# Patient Record
Sex: Female | Born: 1950 | Race: White | Marital: Married | Smoking: Former smoker
Health system: Southern US, Community
[De-identification: ages and names within clinical notes are randomized; demographics above are authoritative.]

## PROBLEM LIST (undated history)

## (undated) DIAGNOSIS — J019 Acute sinusitis, unspecified: Secondary | ICD-10-CM

## (undated) DIAGNOSIS — R5383 Other fatigue: Secondary | ICD-10-CM

## (undated) DIAGNOSIS — M545 Low back pain, unspecified: Secondary | ICD-10-CM

## (undated) DIAGNOSIS — M2550 Pain in unspecified joint: Secondary | ICD-10-CM

## (undated) DIAGNOSIS — E785 Hyperlipidemia, unspecified: Secondary | ICD-10-CM

## (undated) DIAGNOSIS — D649 Anemia, unspecified: Secondary | ICD-10-CM

## (undated) DIAGNOSIS — R42 Dizziness and giddiness: Secondary | ICD-10-CM

## (undated) DIAGNOSIS — E0500 Thyrotoxicosis with diffuse goiter without thyrotoxic crisis or storm: Secondary | ICD-10-CM

## (undated) DIAGNOSIS — N813 Complete uterovaginal prolapse: Secondary | ICD-10-CM

## (undated) DIAGNOSIS — R5381 Other malaise: Secondary | ICD-10-CM

## (undated) HISTORY — DX: Low back pain, unspecified: M54.50

## (undated) HISTORY — DX: Pain in unspecified joint: M25.50

## (undated) HISTORY — DX: Thyrotoxicosis with diffuse goiter without thyrotoxic crisis or storm: E05.00

## (undated) HISTORY — PX: CATARACT EXTRACTION: SUR2

## (undated) HISTORY — DX: Other fatigue: R53.83

## (undated) HISTORY — DX: Other malaise: R53.81

## (undated) HISTORY — PX: TUBAL LIGATION: SHX77

## (undated) HISTORY — DX: Hyperlipidemia, unspecified: E78.5

## (undated) HISTORY — PX: OTHER SURGICAL HISTORY: SHX169

## (undated) HISTORY — DX: Low back pain: M54.5

## (undated) HISTORY — PX: ABDOMINAL HYSTERECTOMY: SHX81

## (undated) HISTORY — DX: Complete uterovaginal prolapse: N81.3

## (undated) HISTORY — DX: Dizziness and giddiness: R42

## (undated) HISTORY — PX: BREAST BIOPSY: SHX20

## (undated) HISTORY — DX: Acute sinusitis, unspecified: J01.90

## (undated) HISTORY — DX: Anemia, unspecified: D64.9

---

## 1999-11-28 ENCOUNTER — Encounter: Admission: RE | Admit: 1999-11-28 | Discharge: 1999-11-28 | Payer: Self-pay | Admitting: Family Medicine

## 1999-11-28 ENCOUNTER — Encounter: Payer: Self-pay | Admitting: Family Medicine

## 2000-11-30 ENCOUNTER — Encounter: Admission: RE | Admit: 2000-11-30 | Discharge: 2000-11-30 | Payer: Self-pay | Admitting: Family Medicine

## 2000-11-30 ENCOUNTER — Encounter: Payer: Self-pay | Admitting: Family Medicine

## 2001-08-24 ENCOUNTER — Encounter: Admission: RE | Admit: 2001-08-24 | Discharge: 2001-08-24 | Payer: Self-pay | Admitting: Family Medicine

## 2001-08-24 ENCOUNTER — Encounter: Payer: Self-pay | Admitting: Family Medicine

## 2002-01-19 ENCOUNTER — Encounter: Admission: RE | Admit: 2002-01-19 | Discharge: 2002-01-19 | Payer: Self-pay | Admitting: Family Medicine

## 2002-01-19 ENCOUNTER — Encounter: Payer: Self-pay | Admitting: Family Medicine

## 2002-01-31 ENCOUNTER — Encounter: Payer: Self-pay | Admitting: Family Medicine

## 2002-01-31 ENCOUNTER — Encounter: Admission: RE | Admit: 2002-01-31 | Discharge: 2002-01-31 | Payer: Self-pay | Admitting: Family Medicine

## 2003-01-23 ENCOUNTER — Encounter: Payer: Self-pay | Admitting: Family Medicine

## 2003-01-23 ENCOUNTER — Encounter: Admission: RE | Admit: 2003-01-23 | Discharge: 2003-01-23 | Payer: Self-pay | Admitting: Family Medicine

## 2004-01-24 ENCOUNTER — Encounter: Admission: RE | Admit: 2004-01-24 | Discharge: 2004-01-24 | Payer: Self-pay | Admitting: Family Medicine

## 2005-01-31 ENCOUNTER — Encounter: Admission: RE | Admit: 2005-01-31 | Discharge: 2005-01-31 | Payer: Self-pay | Admitting: Family Medicine

## 2005-02-14 ENCOUNTER — Encounter: Admission: RE | Admit: 2005-02-14 | Discharge: 2005-02-14 | Payer: Self-pay | Admitting: Family Medicine

## 2005-02-24 ENCOUNTER — Encounter: Admission: RE | Admit: 2005-02-24 | Discharge: 2005-02-24 | Payer: Self-pay | Admitting: Family Medicine

## 2005-03-21 ENCOUNTER — Ambulatory Visit: Payer: Self-pay | Admitting: Internal Medicine

## 2005-04-04 ENCOUNTER — Ambulatory Visit: Payer: Self-pay | Admitting: Internal Medicine

## 2006-01-19 ENCOUNTER — Encounter: Admission: RE | Admit: 2006-01-19 | Discharge: 2006-01-19 | Payer: Self-pay | Admitting: Family Medicine

## 2006-02-03 ENCOUNTER — Encounter: Admission: RE | Admit: 2006-02-03 | Discharge: 2006-02-03 | Payer: Self-pay | Admitting: Family Medicine

## 2007-02-08 ENCOUNTER — Encounter: Admission: RE | Admit: 2007-02-08 | Discharge: 2007-02-08 | Payer: Self-pay | Admitting: Family Medicine

## 2008-03-01 ENCOUNTER — Encounter: Admission: RE | Admit: 2008-03-01 | Discharge: 2008-03-01 | Payer: Self-pay | Admitting: Family Medicine

## 2008-04-26 ENCOUNTER — Encounter: Admission: RE | Admit: 2008-04-26 | Discharge: 2008-04-26 | Payer: Self-pay | Admitting: Family Medicine

## 2009-03-13 ENCOUNTER — Encounter: Admission: RE | Admit: 2009-03-13 | Discharge: 2009-03-13 | Payer: Self-pay | Admitting: Family Medicine

## 2010-03-19 ENCOUNTER — Encounter: Admission: RE | Admit: 2010-03-19 | Discharge: 2010-03-19 | Payer: Self-pay | Admitting: Family Medicine

## 2010-04-04 ENCOUNTER — Ambulatory Visit: Payer: Self-pay | Admitting: Cardiology

## 2010-04-25 ENCOUNTER — Ambulatory Visit: Payer: Self-pay

## 2010-04-25 ENCOUNTER — Encounter: Payer: Self-pay | Admitting: Cardiology

## 2010-04-25 ENCOUNTER — Ambulatory Visit (HOSPITAL_COMMUNITY)
Admission: RE | Admit: 2010-04-25 | Discharge: 2010-04-25 | Payer: Self-pay | Source: Home / Self Care | Attending: Cardiology | Admitting: Cardiology

## 2010-05-26 ENCOUNTER — Encounter: Payer: Self-pay | Admitting: Family Medicine

## 2010-07-16 ENCOUNTER — Ambulatory Visit (INDEPENDENT_AMBULATORY_CARE_PROVIDER_SITE_OTHER): Payer: BC Managed Care – PPO | Admitting: Cardiology

## 2010-07-16 DIAGNOSIS — R002 Palpitations: Secondary | ICD-10-CM

## 2010-07-16 DIAGNOSIS — I10 Essential (primary) hypertension: Secondary | ICD-10-CM

## 2010-07-31 ENCOUNTER — Other Ambulatory Visit: Payer: Self-pay | Admitting: Family Medicine

## 2010-07-31 DIAGNOSIS — M899 Disorder of bone, unspecified: Secondary | ICD-10-CM

## 2010-08-06 ENCOUNTER — Other Ambulatory Visit: Payer: Self-pay | Admitting: *Deleted

## 2010-08-06 DIAGNOSIS — I10 Essential (primary) hypertension: Secondary | ICD-10-CM

## 2010-08-06 MED ORDER — LISINOPRIL 5 MG PO TABS: 5.0000 mg | ORAL_TABLET | Freq: Every day | ORAL | Status: DC

## 2010-08-06 NOTE — Telephone Encounter (Signed)
escribe medication per fax request  

## 2010-08-26 ENCOUNTER — Ambulatory Visit
Admission: RE | Admit: 2010-08-26 | Discharge: 2010-08-26 | Disposition: A | Discharge status: Home / Self Care | Payer: BC Managed Care – PPO | Source: Ambulatory Visit | Attending: Family Medicine | Admitting: Family Medicine

## 2010-08-26 DIAGNOSIS — M899 Disorder of bone, unspecified: Secondary | ICD-10-CM

## 2011-02-12 ENCOUNTER — Other Ambulatory Visit: Payer: Self-pay | Admitting: Family Medicine

## 2011-02-12 DIAGNOSIS — Z1231 Encounter for screening mammogram for malignant neoplasm of breast: Secondary | ICD-10-CM

## 2011-03-25 ENCOUNTER — Ambulatory Visit: Payer: BC Managed Care – PPO

## 2011-04-01 ENCOUNTER — Ambulatory Visit
Admission: RE | Admit: 2011-04-01 | Discharge: 2011-04-01 | Disposition: A | Discharge status: Home / Self Care | Payer: BC Managed Care – PPO | Source: Ambulatory Visit | Attending: Family Medicine | Admitting: Family Medicine

## 2011-04-01 DIAGNOSIS — Z1231 Encounter for screening mammogram for malignant neoplasm of breast: Secondary | ICD-10-CM

## 2012-02-20 ENCOUNTER — Other Ambulatory Visit: Payer: Self-pay | Admitting: Family Medicine

## 2012-02-20 DIAGNOSIS — Z9289 Personal history of other medical treatment: Secondary | ICD-10-CM

## 2012-04-06 ENCOUNTER — Ambulatory Visit (INDEPENDENT_AMBULATORY_CARE_PROVIDER_SITE_OTHER): Payer: BC Managed Care – PPO

## 2012-04-06 DIAGNOSIS — Z9289 Personal history of other medical treatment: Secondary | ICD-10-CM

## 2012-04-06 DIAGNOSIS — R928 Other abnormal and inconclusive findings on diagnostic imaging of breast: Secondary | ICD-10-CM

## 2012-04-06 DIAGNOSIS — Z1231 Encounter for screening mammogram for malignant neoplasm of breast: Secondary | ICD-10-CM

## 2012-04-09 ENCOUNTER — Other Ambulatory Visit: Payer: Self-pay | Admitting: Family Medicine

## 2012-04-09 DIAGNOSIS — R928 Other abnormal and inconclusive findings on diagnostic imaging of breast: Secondary | ICD-10-CM

## 2012-04-21 ENCOUNTER — Ambulatory Visit
Admission: RE | Admit: 2012-04-21 | Discharge: 2012-04-21 | Disposition: A | Discharge status: Home / Self Care | Payer: BC Managed Care – PPO | Source: Ambulatory Visit | Attending: Family Medicine | Admitting: Family Medicine

## 2012-04-21 DIAGNOSIS — R928 Other abnormal and inconclusive findings on diagnostic imaging of breast: Secondary | ICD-10-CM

## 2012-04-22 ENCOUNTER — Other Ambulatory Visit: Payer: Self-pay | Admitting: Family Medicine

## 2012-04-22 DIAGNOSIS — R921 Mammographic calcification found on diagnostic imaging of breast: Secondary | ICD-10-CM

## 2012-05-03 ENCOUNTER — Other Ambulatory Visit: Payer: Self-pay | Admitting: Family Medicine

## 2012-05-03 DIAGNOSIS — R921 Mammographic calcification found on diagnostic imaging of breast: Secondary | ICD-10-CM

## 2012-05-04 ENCOUNTER — Ambulatory Visit
Admission: RE | Admit: 2012-05-04 | Discharge: 2012-05-04 | Disposition: A | Discharge status: Home / Self Care | Payer: BC Managed Care – PPO | Source: Ambulatory Visit | Attending: Family Medicine | Admitting: Family Medicine

## 2012-05-04 ENCOUNTER — Other Ambulatory Visit: Payer: BC Managed Care – PPO

## 2012-05-04 DIAGNOSIS — R921 Mammographic calcification found on diagnostic imaging of breast: Secondary | ICD-10-CM

## 2012-09-24 ENCOUNTER — Emergency Department (HOSPITAL_BASED_OUTPATIENT_CLINIC_OR_DEPARTMENT_OTHER)
Admission: EM | Admit: 2012-09-24 | Discharge: 2012-09-24 | Disposition: A | Discharge status: Home / Self Care | Payer: BC Managed Care – PPO | Attending: Emergency Medicine | Admitting: Emergency Medicine

## 2012-09-24 ENCOUNTER — Emergency Department (HOSPITAL_BASED_OUTPATIENT_CLINIC_OR_DEPARTMENT_OTHER): Payer: BC Managed Care – PPO

## 2012-09-24 ENCOUNTER — Encounter (HOSPITAL_BASED_OUTPATIENT_CLINIC_OR_DEPARTMENT_OTHER): Payer: Self-pay | Admitting: Emergency Medicine

## 2012-09-24 DIAGNOSIS — Z7982 Long term (current) use of aspirin: Secondary | ICD-10-CM | POA: Insufficient documentation

## 2012-09-24 DIAGNOSIS — M19049 Primary osteoarthritis, unspecified hand: Secondary | ICD-10-CM | POA: Insufficient documentation

## 2012-09-24 DIAGNOSIS — Y939 Activity, unspecified: Secondary | ICD-10-CM | POA: Insufficient documentation

## 2012-09-24 DIAGNOSIS — S8990XA Unspecified injury of unspecified lower leg, initial encounter: Secondary | ICD-10-CM | POA: Insufficient documentation

## 2012-09-24 DIAGNOSIS — Y929 Unspecified place or not applicable: Secondary | ICD-10-CM | POA: Insufficient documentation

## 2012-09-24 DIAGNOSIS — Z8709 Personal history of other diseases of the respiratory system: Secondary | ICD-10-CM | POA: Insufficient documentation

## 2012-09-24 DIAGNOSIS — Z862 Personal history of diseases of the blood and blood-forming organs and certain disorders involving the immune mechanism: Secondary | ICD-10-CM | POA: Insufficient documentation

## 2012-09-24 DIAGNOSIS — M19042 Primary osteoarthritis, left hand: Secondary | ICD-10-CM

## 2012-09-24 DIAGNOSIS — Z8639 Personal history of other endocrine, nutritional and metabolic disease: Secondary | ICD-10-CM | POA: Insufficient documentation

## 2012-09-24 DIAGNOSIS — S99919A Unspecified injury of unspecified ankle, initial encounter: Secondary | ICD-10-CM | POA: Insufficient documentation

## 2012-09-24 DIAGNOSIS — W2209XA Striking against other stationary object, initial encounter: Secondary | ICD-10-CM | POA: Insufficient documentation

## 2012-09-24 DIAGNOSIS — Z79899 Other long term (current) drug therapy: Secondary | ICD-10-CM | POA: Insufficient documentation

## 2012-09-24 DIAGNOSIS — E785 Hyperlipidemia, unspecified: Secondary | ICD-10-CM | POA: Insufficient documentation

## 2012-09-24 DIAGNOSIS — Z8659 Personal history of other mental and behavioral disorders: Secondary | ICD-10-CM | POA: Insufficient documentation

## 2012-09-24 MED ORDER — NAPROXEN 500 MG PO TABS
500.0000 mg | ORAL_TABLET | Freq: Two times a day (BID) | ORAL | Status: DC
Start: 2012-09-24 — End: 2013-04-12

## 2012-09-24 NOTE — ED Provider Notes (Signed)
History     CSN: [id number]  Arrival date & time 09/24/12  1524   First MD Initiated Contact with Patient 09/24/12 1543      Chief Complaint  Patient presents with  . Hand Injury    (Consider location/radiation/quality/duration/timing/severity/associated sxs/prior treatment) Patient is a 62 y.o. female presenting with hand injury. The history is provided by the patient. No language interpreter was used.  Hand Injury Location:  Hand Hand location:  R hand Pain details:    Radiates to:  Does not radiate   Severity:  No pain   Onset quality:  Sudden   Duration:  1 day   Timing:  Intermittent   Progression:  Waxing and waning Chronicity:  New Dislocation: no   Foreign body present:  No foreign bodies Prior injury to area:  No Ineffective treatments:  None tried   Past Medical History  Diagnosis Date  . Acute sinusitis, unspecified   . Other malaise and fatigue   . Lower back pain   . Light headedness   . Joint pain   . Hyperlipidemia   . Graves' disease   . "Walking corpse" syndrome     Past Surgical History  Procedure Laterality Date  . Cataract extraction    . Tubal ligation      No family history on file.  History  Substance Use Topics  . Smoking status: Never Smoker   . Smokeless tobacco: Not on file  . Alcohol Use: Yes     Comment: socially on weekends    OB History   Grav Para Term Preterm Abortions TAB SAB Ect Mult Living                  Review of Systems  Musculoskeletal: Positive for arthralgias.  All other systems reviewed and are negative.    Allergies  Erythromycin  Home Medications   Current Outpatient Rx  Name  Route  Sig  Dispense  Refill  . aspirin 81 MG tablet   Oral   Take 81 mg by mouth daily.         . calcium carbonate 1250 MG capsule   Oral   Take 1,250 mg by mouth 2 (two) times daily with a meal.         . fish oil-omega-3 fatty acids 1000 MG capsule   Oral   Take 1 g by mouth 2 (two) times daily.        Marland Kitchen glucosamine-chondroitin 500-400 MG tablet   Oral   Take 1 tablet by mouth 3 (three) times daily.         . Multiple Vitamin (MULTIVITAMIN WITH MINERALS) TABS   Oral   Take 1 tablet by mouth daily.         . potassium chloride SA (K-DUR,KLOR-CON) 20 MEQ tablet   Oral   Take 20 mEq by mouth 2 (two) times daily.         . simvastatin (ZOCOR) 10 MG tablet   Oral   Take 10 mg by mouth at bedtime.         Marland Kitchen EXPIRED: lisinopril (PRINIVIL,ZESTRIL) 5 MG tablet   Oral   Take 1 tablet (5 mg total) by mouth daily.   30 tablet   5   . naproxen (NAPROSYN) 500 MG tablet   Oral   Take 1 tablet (500 mg total) by mouth 2 (two) times daily.   14 tablet   0     BP 133/89  Pulse 75  Temp(Src)  98 F (36.7 C) (Oral)  Resp 18  Ht 5\' 7"  (1.702 m)  Wt 163 lb (73.936 kg)  BMI 25.52 kg/m2  SpO2 98%  Physical Exam  Nursing note and vitals reviewed. Constitutional: She is oriented to person, place, and time. She appears well-developed and well-nourished.  HENT:  Head: Normocephalic.  Eyes: Pupils are equal, round, and reactive to light.  Neck: Normal range of motion.  Cardiovascular: Normal rate and regular rhythm.   Pulmonary/Chest: Effort normal and breath sounds normal.  Abdominal: Soft. Bowel sounds are normal.  Musculoskeletal: Normal range of motion. She exhibits edema.       Hands: Lymphadenopathy:    She has no cervical adenopathy.  Neurological: She is alert and oriented to person, place, and time.  Skin: Skin is warm and dry. There is erythema.  Psychiatric: She has a normal mood and affect. Her behavior is normal. Judgment and thought content normal.    ED Course  Procedures (including critical care time)  Labs Reviewed - No data to display Dg Hand Complete Right  09/24/2012   *RADIOLOGY REPORT*  Clinical Data: Right hand swelling and soreness for several days, pain, redness at base of ring finger, struck hand against cabinet 1 week ago  RIGHT HAND -  COMPLETE 3+ VIEW  Comparison: None  Findings: Diffuse osseous demineralization. Minimal scattered degenerative changes of interphalangeal joints. No acute fracture, dislocation or bone destruction. Soft tissue swelling at dorsum of hand overlying the distal metacarpals and MCP joints.  IMPRESSION: Scattered degenerative changes as above. No definite acute osseous abnormalities.   Original Report Authenticated By: Ulyses Southward, M.D.     1. Osteoarthritis of hand, left       MDM          Jimmye Norman, NP 09/24/12 2244

## 2012-09-24 NOTE — ED Notes (Signed)
Accidentally struck right hand against a wall or door last week.  Yesterday while she was exercising on her elliptical, she noticed that her right hand appeared swollen and she had difficulty opening and closing her hand.

## 2012-09-24 NOTE — ED Provider Notes (Signed)
Medical screening examination/treatment/procedure(s) were performed by non-physician practitioner and as supervising physician I was immediately available for consultation/collaboration.  Burkley Dech K Linker, MD 09/24/12 2338 

## 2012-12-31 ENCOUNTER — Encounter: Payer: Self-pay | Admitting: Internal Medicine

## 2013-01-04 ENCOUNTER — Encounter: Payer: Self-pay | Admitting: Internal Medicine

## 2013-03-04 ENCOUNTER — Encounter: Payer: Self-pay | Admitting: Internal Medicine

## 2013-03-18 ENCOUNTER — Encounter: Payer: BC Managed Care – PPO | Admitting: Internal Medicine

## 2013-04-11 ENCOUNTER — Other Ambulatory Visit: Payer: Self-pay | Admitting: Family Medicine

## 2013-04-11 DIAGNOSIS — Z1231 Encounter for screening mammogram for malignant neoplasm of breast: Secondary | ICD-10-CM

## 2013-04-12 ENCOUNTER — Ambulatory Visit (AMBULATORY_SURGERY_CENTER): Payer: Self-pay

## 2013-04-12 VITALS — Ht 67.0 in | Wt 167.2 lb

## 2013-04-12 DIAGNOSIS — Z8371 Family history of colonic polyps: Secondary | ICD-10-CM

## 2013-04-12 MED ORDER — MOVIPREP 100 G PO SOLR: ORAL | Status: DC

## 2013-04-19 ENCOUNTER — Encounter: Payer: Self-pay | Admitting: Internal Medicine

## 2013-04-25 ENCOUNTER — Ambulatory Visit (AMBULATORY_SURGERY_CENTER): Payer: BC Managed Care – PPO | Admitting: Internal Medicine

## 2013-04-25 ENCOUNTER — Encounter: Payer: Self-pay | Admitting: Internal Medicine

## 2013-04-25 VITALS — BP 132/84 | HR 57 | Temp 97.1°F | Resp 17 | Ht 67.0 in | Wt 167.0 lb

## 2013-04-25 DIAGNOSIS — Z8371 Family history of colonic polyps: Secondary | ICD-10-CM

## 2013-04-25 DIAGNOSIS — Z1211 Encounter for screening for malignant neoplasm of colon: Secondary | ICD-10-CM

## 2013-04-25 MED ORDER — SODIUM CHLORIDE 0.9 % IV SOLN: 500.0000 mL | INTRAVENOUS | Status: DC

## 2013-04-25 NOTE — Patient Instructions (Signed)
YOU HAD AN ENDOSCOPIC PROCEDURE TODAY AT THE [facility]: Refer to the procedure report that was given to you for any specific questions about what was found during the examination.  If the procedure report does not answer your questions, please call your gastroenterologist to clarify.  If you requested that your care partner not be given the details of your procedure findings, then the procedure report has been included in a sealed envelope for you to review at your convenience later.  YOU SHOULD EXPECT: Some feelings of bloating in the abdomen. Passage of more gas than usual.  Walking can help get rid of the air that was put into your GI tract during the procedure and reduce the bloating. If you had a lower endoscopy (such as a colonoscopy or flexible sigmoidoscopy) you may notice spotting of blood in your stool or on the toilet paper. If you underwent a bowel prep for your procedure, then you may not have a normal bowel movement for a few days.  DIET: Your first meal following the procedure should be a light meal and then it is ok to progress to your normal diet.  A half-sandwich or bowl of soup is an example of a good first meal.  Heavy or fried foods are harder to digest and may make you feel nauseous or bloated.  Likewise meals heavy in dairy and vegetables can cause extra gas to form and this can also increase the bloating.  Drink plenty of fluids but you should avoid alcoholic beverages for 24 hours.  ACTIVITY: Your care partner should take you home directly after the procedure.  You should plan to take it easy, moving slowly for the rest of the day.  You can resume normal activity the day after the procedure however you should NOT DRIVE or use heavy machinery for 24 hours (because of the sedation medicines used during the test).    SYMPTOMS TO REPORT IMMEDIATELY: A gastroenterologist can be reached at any hour.  During normal business hours, 8:30 AM to 5:00 PM Monday through Friday,  call [phone].  After hours and on weekends, please call the GI answering service at [phone] who will take a message and have the physician on call contact you.   Following lower endoscopy (colonoscopy or flexible sigmoidoscopy):  Excessive amounts of blood in the stool  Significant tenderness or worsening of abdominal pains  Swelling of the abdomen that is new, acute  Fever of 100F or higher   FOLLOW UP: If any biopsies were taken you will be contacted by phone or by letter within the next 1-3 weeks.  Call your gastroenterologist if you have not heard about the biopsies in 3 weeks.  Our staff will call the home number listed on your records the next business day following your procedure to check on you and address any questions or concerns that you may have at that time regarding the information given to you following your procedure. This is a courtesy call and so if there is no answer at the home number and we have not heard from you through the emergency physician on call, we will assume that you have returned to your regular daily activities without incident.  SIGNATURES/CONFIDENTIALITY: You and/or your care partner have signed paperwork which will be entered into your electronic medical record.  These signatures attest to the fact that that the information above on your After Visit Summary has been reviewed and is understood.  Full responsibility of the confidentiality of   this discharge information lies with you and/or your care-partner.    A handout was given to your care partner on a high fiber diet with liberal fluid intake. You may resume your current medications today. Please call if any questions or concerns.    

## 2013-04-25 NOTE — Op Note (Signed)
[facility] [address] [place], [address]   COLONOSCOPY PROCEDURE REPORT  PATIENT: Dawn Underwood, Dawn Underwood  MR#: [id number] BIRTHDATE: Dec 03, 1950 , 62  yrs. old GENDER: Female ENDOSCOPIST: Hart Carwin, MD REFERRED AO:ZHYQ Arlyce Dice, [place] PROCEDURE DATE:  04/25/2013 PROCEDURE:   Colonoscopy, screening First Screening Colonoscopy - Avg.  risk and is 50 yrs.  old or older - No.  Prior Negative Screening - Now for repeat screening. Above average risk  History of Adenoma - Now for follow-up colonoscopy & has been > or = to 3 yrs.  N/A  Polyps Removed Today? No.  Recommend repeat exam, <10 yrs? No. ASA CLASS:   Class II INDICATIONS:family history of colon polyps in patient's mother. Last colonoscopy was in 2006 and again in 2007 elsewhere,no records. MEDICATIONS: MAC sedation, administered by CRNA and propofol (Diprivan) 200mg  IV  DESCRIPTION OF PROCEDURE:   After the risks benefits and alternatives of the procedure were thoroughly explained, informed consent was obtained.  A digital rectal exam revealed no abnormalities of the rectum.   The LB [id number]-[id number]  endoscope was introduced through the anus and advanced to the cecum, which was identified by both the appendix and ileocecal valve. No adverse events experienced.   The quality of the prep was good, using MoviPrep  The instrument was then slowly withdrawn as the colon was fully examined.      COLON FINDINGS: A normal appearing cecum, ileocecal valve, and appendiceal orifice were identified.  The ascending, hepatic flexure, transverse, splenic flexure, descending, sigmoid colon and rectum appeared unremarkable.  No polyps or cancers were seen. Retroflexed views revealed no abnormalities. The time to cecum=6 minutes 17 seconds.  Withdrawal time=6 minutes 10 seconds.  The scope was withdrawn and the procedure completed. COMPLICATIONS: There were no complications.  ENDOSCOPIC IMPRESSION: Normal  colon  RECOMMENDATIONS: high fiber diet Recall colonoscopy in 10 years   eSigned:  Hart Carwin, MD 04/25/2013 3:07 PM   cc:

## 2013-04-25 NOTE — Progress Notes (Signed)
No complaints noted in the recovery room. Maw   

## 2013-04-25 NOTE — Progress Notes (Signed)
Procedure ends, to recovery, report given and VSS. 

## 2013-04-26 ENCOUNTER — Telehealth: Payer: Self-pay | Admitting: *Deleted

## 2013-04-26 NOTE — Telephone Encounter (Signed)
No answer, left message to call if questions or concerns. 

## 2013-05-10 ENCOUNTER — Ambulatory Visit (INDEPENDENT_AMBULATORY_CARE_PROVIDER_SITE_OTHER): Payer: BC Managed Care – PPO

## 2013-05-10 DIAGNOSIS — Z1231 Encounter for screening mammogram for malignant neoplasm of breast: Secondary | ICD-10-CM

## 2013-08-22 ENCOUNTER — Other Ambulatory Visit: Payer: Self-pay | Admitting: Family Medicine

## 2013-08-22 DIAGNOSIS — M8580 Other specified disorders of bone density and structure, unspecified site: Secondary | ICD-10-CM

## 2013-08-23 ENCOUNTER — Ambulatory Visit (INDEPENDENT_AMBULATORY_CARE_PROVIDER_SITE_OTHER): Payer: BC Managed Care – PPO

## 2013-08-23 DIAGNOSIS — M949 Disorder of cartilage, unspecified: Secondary | ICD-10-CM

## 2013-08-23 DIAGNOSIS — M8580 Other specified disorders of bone density and structure, unspecified site: Secondary | ICD-10-CM

## 2013-08-23 DIAGNOSIS — M899 Disorder of bone, unspecified: Secondary | ICD-10-CM

## 2013-09-05 ENCOUNTER — Other Ambulatory Visit: Payer: BC Managed Care – PPO

## 2014-04-11 ENCOUNTER — Other Ambulatory Visit: Payer: Self-pay | Admitting: Family Medicine

## 2014-04-11 DIAGNOSIS — Z1231 Encounter for screening mammogram for malignant neoplasm of breast: Secondary | ICD-10-CM

## 2014-05-11 ENCOUNTER — Ambulatory Visit (INDEPENDENT_AMBULATORY_CARE_PROVIDER_SITE_OTHER): Payer: BLUE CROSS/BLUE SHIELD

## 2014-05-11 DIAGNOSIS — Z1231 Encounter for screening mammogram for malignant neoplasm of breast: Secondary | ICD-10-CM

## 2015-04-11 ENCOUNTER — Other Ambulatory Visit: Payer: Self-pay | Admitting: Family Medicine

## 2015-04-11 DIAGNOSIS — Z1231 Encounter for screening mammogram for malignant neoplasm of breast: Secondary | ICD-10-CM

## 2015-04-16 ENCOUNTER — Encounter (HOSPITAL_COMMUNITY): Payer: Self-pay

## 2015-04-16 ENCOUNTER — Inpatient Hospital Stay (HOSPITAL_COMMUNITY)
Admission: EM | Admit: 2015-04-16 | Discharge: 2015-04-18 | DRG: 392 | Disposition: A | Discharge status: Home / Self Care | Payer: Medicare HMO | Attending: Internal Medicine | Admitting: Internal Medicine

## 2015-04-16 ENCOUNTER — Emergency Department (HOSPITAL_COMMUNITY): Payer: Medicare HMO

## 2015-04-16 DIAGNOSIS — K625 Hemorrhage of anus and rectum: Secondary | ICD-10-CM | POA: Diagnosis present

## 2015-04-16 DIAGNOSIS — E0500 Thyrotoxicosis with diffuse goiter without thyrotoxic crisis or storm: Secondary | ICD-10-CM | POA: Diagnosis present

## 2015-04-16 DIAGNOSIS — Z833 Family history of diabetes mellitus: Secondary | ICD-10-CM

## 2015-04-16 DIAGNOSIS — R55 Syncope and collapse: Secondary | ICD-10-CM | POA: Diagnosis present

## 2015-04-16 DIAGNOSIS — R112 Nausea with vomiting, unspecified: Secondary | ICD-10-CM | POA: Diagnosis not present

## 2015-04-16 DIAGNOSIS — Z79899 Other long term (current) drug therapy: Secondary | ICD-10-CM

## 2015-04-16 DIAGNOSIS — K529 Noninfective gastroenteritis and colitis, unspecified: Principal | ICD-10-CM | POA: Diagnosis present

## 2015-04-16 DIAGNOSIS — Z881 Allergy status to other antibiotic agents status: Secondary | ICD-10-CM

## 2015-04-16 DIAGNOSIS — I10 Essential (primary) hypertension: Secondary | ICD-10-CM | POA: Diagnosis present

## 2015-04-16 DIAGNOSIS — Z7982 Long term (current) use of aspirin: Secondary | ICD-10-CM

## 2015-04-16 DIAGNOSIS — E876 Hypokalemia: Secondary | ICD-10-CM | POA: Diagnosis present

## 2015-04-16 DIAGNOSIS — E785 Hyperlipidemia, unspecified: Secondary | ICD-10-CM | POA: Diagnosis present

## 2015-04-16 DIAGNOSIS — D696 Thrombocytopenia, unspecified: Secondary | ICD-10-CM | POA: Diagnosis present

## 2015-04-16 LAB — CBC
HCT: 37.5 % (ref 36.0–46.0)
Hemoglobin: 12.7 g/dL (ref 12.0–15.0)
MCH: 29.6 pg (ref 26.0–34.0)
MCHC: 33.9 g/dL (ref 30.0–36.0)
MCV: 87.4 fL (ref 78.0–100.0)
PLATELETS: 123 10*3/uL — AB (ref 150–400)
RBC: 4.29 MIL/uL (ref 3.87–5.11)
RDW: 13.2 % (ref 11.5–15.5)
WBC: 9.9 10*3/uL (ref 4.0–10.5)

## 2015-04-16 LAB — URINALYSIS, ROUTINE W REFLEX MICROSCOPIC
BILIRUBIN URINE: NEGATIVE
GLUCOSE, UA: NEGATIVE mg/dL
KETONES UR: NEGATIVE mg/dL
Nitrite: NEGATIVE
PROTEIN: NEGATIVE mg/dL
Specific Gravity, Urine: 1.005 (ref 1.005–1.030)
pH: 6 (ref 5.0–8.0)

## 2015-04-16 LAB — COMPREHENSIVE METABOLIC PANEL
ALT: 16 U/L (ref 14–54)
AST: 25 U/L (ref 15–41)
Albumin: 4.3 g/dL (ref 3.5–5.0)
Alkaline Phosphatase: 60 U/L (ref 38–126)
Anion gap: 10 (ref 5–15)
BUN: 12 mg/dL (ref 6–20)
CHLORIDE: 104 mmol/L (ref 101–111)
CO2: 26 mmol/L (ref 22–32)
CREATININE: 0.75 mg/dL (ref 0.44–1.00)
Calcium: 9.9 mg/dL (ref 8.9–10.3)
GFR calc Af Amer: >60 mL/min (ref >60)
Glucose, Bld: 101 mg/dL — ABNORMAL HIGH (ref 65–99)
Potassium: 3.6 mmol/L (ref 3.5–5.1)
Sodium: 140 mmol/L (ref 135–145)
Total Bilirubin: 1.3 mg/dL — ABNORMAL HIGH (ref 0.3–1.2)
Total Protein: 8 g/dL (ref 6.5–8.1)

## 2015-04-16 LAB — HEMOGLOBIN AND HEMATOCRIT, BLOOD
HEMATOCRIT: 30.5 % — AB (ref 36.0–46.0)
Hemoglobin: 10.2 g/dL — ABNORMAL LOW (ref 12.0–15.0)

## 2015-04-16 LAB — ABO/RH: ABO/RH(D): O POS

## 2015-04-16 LAB — URINE MICROSCOPIC-ADD ON

## 2015-04-16 LAB — LIPASE, BLOOD: LIPASE: 40 U/L (ref 11–51)

## 2015-04-16 LAB — TYPE AND SCREEN
ABO/RH(D): O POS
Antibody Screen: NEGATIVE

## 2015-04-16 LAB — POC OCCULT BLOOD, ED: Fecal Occult Bld: POSITIVE — AB

## 2015-04-16 MED ORDER — DEXTROSE-NACL 5-0.45 % IV SOLN
INTRAVENOUS | Status: DC
  Administered 2015-04-16 – 2015-04-18 (×4): via INTRAVENOUS

## 2015-04-16 MED ORDER — SODIUM CHLORIDE 0.9 % IV BOLUS (SEPSIS)
1000.0000 mL | Freq: Once | INTRAVENOUS | Status: AC
Start: 2015-04-16 — End: 2015-04-16
  Administered 2015-04-16: 1000 mL via INTRAVENOUS

## 2015-04-16 MED ORDER — OXYCODONE HCL 5 MG PO TABS: 5.0000 mg | ORAL_TABLET | ORAL | Status: DC | PRN

## 2015-04-16 MED ORDER — METRONIDAZOLE IN NACL 5-0.79 MG/ML-% IV SOLN
500.0000 mg | Freq: Once | INTRAVENOUS | Status: AC
  Administered 2015-04-16: 500 mg via INTRAVENOUS
  Filled 2015-04-16: qty 100

## 2015-04-16 MED ORDER — METRONIDAZOLE IN NACL 5-0.79 MG/ML-% IV SOLN
500.0000 mg | Freq: Three times a day (TID) | INTRAVENOUS | Status: DC
  Administered 2015-04-16 – 2015-04-18 (×5): 500 mg via INTRAVENOUS
  Filled 2015-04-16 (×5): qty 100

## 2015-04-16 MED ORDER — PANTOPRAZOLE SODIUM 40 MG IV SOLR
40.0000 mg | Freq: Once | INTRAVENOUS | Status: AC
  Administered 2015-04-16: 40 mg via INTRAVENOUS
  Filled 2015-04-16: qty 40

## 2015-04-16 MED ORDER — MORPHINE SULFATE (PF) 2 MG/ML IV SOLN
2.0000 mg | Freq: Once | INTRAVENOUS | Status: AC
  Administered 2015-04-16: 2 mg via INTRAVENOUS
  Filled 2015-04-16: qty 1

## 2015-04-16 MED ORDER — METHOCARBAMOL 500 MG PO TABS: 500.0000 mg | ORAL_TABLET | Freq: Two times a day (BID) | ORAL | Status: DC | PRN

## 2015-04-16 MED ORDER — CETYLPYRIDINIUM CHLORIDE 0.05 % MT LIQD
7.0000 mL | Freq: Two times a day (BID) | OROMUCOSAL | Status: DC
  Administered 2015-04-16 – 2015-04-18 (×4): 7 mL via OROMUCOSAL

## 2015-04-16 MED ORDER — ACETAMINOPHEN 325 MG PO TABS: 650.0000 mg | ORAL_TABLET | Freq: Four times a day (QID) | ORAL | Status: DC | PRN

## 2015-04-16 MED ORDER — ONDANSETRON HCL 4 MG PO TABS: 4.0000 mg | ORAL_TABLET | Freq: Four times a day (QID) | ORAL | Status: DC | PRN

## 2015-04-16 MED ORDER — CIPROFLOXACIN IN D5W 400 MG/200ML IV SOLN
400.0000 mg | Freq: Two times a day (BID) | INTRAVENOUS | Status: DC
  Administered 2015-04-16 – 2015-04-17 (×3): 400 mg via INTRAVENOUS
  Filled 2015-04-16 (×3): qty 200

## 2015-04-16 MED ORDER — MORPHINE SULFATE (PF) 2 MG/ML IV SOLN: 1.0000 mg | INTRAVENOUS | Status: DC | PRN

## 2015-04-16 MED ORDER — IOHEXOL 300 MG/ML  SOLN
50.0000 mL | Freq: Once | INTRAMUSCULAR | Status: AC | PRN
  Administered 2015-04-16: 50 mL via ORAL

## 2015-04-16 MED ORDER — IOHEXOL 300 MG/ML  SOLN
100.0000 mL | Freq: Once | INTRAMUSCULAR | Status: AC | PRN
  Administered 2015-04-16: 100 mL via INTRAVENOUS

## 2015-04-16 MED ORDER — CHLORHEXIDINE GLUCONATE 0.12 % MT SOLN
15.0000 mL | Freq: Two times a day (BID) | OROMUCOSAL | Status: DC
  Administered 2015-04-16 – 2015-04-18 (×4): 15 mL via OROMUCOSAL
  Filled 2015-04-16 (×5): qty 15

## 2015-04-16 MED ORDER — ONDANSETRON HCL 4 MG/2ML IJ SOLN: 4.0000 mg | Freq: Four times a day (QID) | INTRAMUSCULAR | Status: DC | PRN

## 2015-04-16 MED ORDER — ONDANSETRON HCL 4 MG/2ML IJ SOLN
4.0000 mg | Freq: Once | INTRAMUSCULAR | Status: AC
  Administered 2015-04-16: 4 mg via INTRAVENOUS
  Filled 2015-04-16: qty 2

## 2015-04-16 MED ORDER — CIPROFLOXACIN IN D5W 400 MG/200ML IV SOLN
400.0000 mg | Freq: Once | INTRAVENOUS | Status: AC
  Administered 2015-04-16: 400 mg via INTRAVENOUS
  Filled 2015-04-16: qty 200

## 2015-04-16 MED ORDER — PANTOPRAZOLE SODIUM 40 MG IV SOLR
40.0000 mg | Freq: Two times a day (BID) | INTRAVENOUS | Status: DC
  Administered 2015-04-16 – 2015-04-17 (×4): 40 mg via INTRAVENOUS
  Filled 2015-04-16 (×3): qty 40

## 2015-04-16 MED ORDER — ACETAMINOPHEN 650 MG RE SUPP: 650.0000 mg | Freq: Four times a day (QID) | RECTAL | Status: DC | PRN

## 2015-04-16 NOTE — ED Notes (Addendum)
Pt went to [employer] yesterday.  Stomach was cramping.  Aprrox at 1pm ish patient stated she started sweating.  Pt began vomiting at restaurant.  Diarrhea started soon after.  Pt also states rectal bleeding.  Pt believes it is more blood than what she would have with her normal hemorrhoids.

## 2015-04-16 NOTE — ED Notes (Signed)
Patient transported to CT 

## 2015-04-16 NOTE — ED Provider Notes (Signed)
CSN: [id number]     Arrival date & time 04/16/15  [id number] History   First MD Initiated Contact with Patient 04/16/15 [phone]     Chief Complaint  Patient presents with  . Emesis  . Diarrhea     (Consider location/radiation/quality/duration/timing/severity/associated sxs/prior Treatment) Patient is a 64 y.o. female presenting with hematochezia. The history is provided by the patient.  Rectal Bleeding Quality:  Bright red Amount:  Moderate Duration:  2 days Timing:  Constant Progression:  Worsening Chronicity:  New Similar prior episodes: no   Relieved by:  Nothing Worsened by:  Nothing tried Ineffective treatments:  None tried Associated symptoms: abdominal pain (LLQ)   Associated symptoms: no dizziness, no fever and no vomiting   Risk factors: no anticoagulant use and no hx of IBD    64 yo F with a BRBPR.  Multiple episodes since last night.  Noticed blood mixed with stool, then started having just bright red blood.  Some mild LLQ pain.  Denies fevers, chills.  Vomiting last night improved this morning.  Past Medical History  Diagnosis Date  . Acute sinusitis, unspecified   . Other malaise and fatigue   . Lower back pain   . Light headedness   . Joint pain   . Hyperlipidemia   . Graves' disease   . Cystocele or rectocele with complete uterine prolapse   . Anemia    Past Surgical History  Procedure Laterality Date  . Cataract extraction      Bil  . Tubal ligation    . Abdominal hysterectomy    . Bladder mesh     Family History  Problem Relation Age of Onset  . Diabetes Father    Social History  Substance Use Topics  . Smoking status: Never Smoker   . Smokeless tobacco: Never Used  . Alcohol Use: 2.4 oz/week    4 Glasses of wine per week     Comment: socially on weekends   OB History    No data available     Review of Systems  Constitutional: Negative for fever and chills.  HENT: Negative for congestion and rhinorrhea.   Eyes: Negative for redness and  visual disturbance.  Respiratory: Negative for shortness of breath and wheezing.   Cardiovascular: Negative for chest pain and palpitations.  Gastrointestinal: Positive for abdominal pain (LLQ), blood in stool and hematochezia. Negative for nausea and vomiting.  Genitourinary: Negative for dysuria and urgency.  Musculoskeletal: Negative for myalgias and arthralgias.  Skin: Negative for pallor and wound.  Neurological: Negative for dizziness and headaches.      Allergies  Erythromycin  Home Medications   Prior to Admission medications   Medication Sig Start Date End Date Taking? Authorizing Provider  aspirin 81 MG tablet Take 81 mg by mouth every other day.    Yes Historical Provider, MD  calcium carbonate 1250 MG capsule Take 1,250 mg by mouth 2 (two) times daily with a meal.   Yes Historical Provider, MD  Coconut Oil 1000 MG CAPS Take 1,000 mg by mouth daily.   Yes Historical Provider, MD  Coenzyme Q10 (CO Q 10) 10 MG CAPS Take 1 capsule by mouth 2 (two) times daily.   Yes Historical Provider, MD  ferrous sulfate 325 (65 FE) MG tablet Take 325 mg by mouth daily.   Yes Historical Provider, MD  fish oil-omega-3 fatty acids 1000 MG capsule Take 1 g by mouth 2 (two) times daily.    Yes Historical Provider, MD  glucosamine-chondroitin 500-400 MG  tablet Take 1 tablet by mouth 2 (two) times daily.    Yes Historical Provider, MD  lisinopril (PRINIVIL,ZESTRIL) 10 MG tablet Take 5 mg by mouth daily. 12/06/13  Yes Historical Provider, MD  methocarbamol (ROBAXIN) 500 MG tablet Take 1 tablet by mouth 2 (two) times daily as needed. Muscle spasms. 03/28/15  Yes Historical Provider, MD  naproxen (NAPROSYN) 500 MG tablet Take 500 mg by mouth daily as needed for moderate pain.  09/24/12  Yes Etta Quill, NP  Nutritional Supplements (JOINT FORMULA PO) Take 1-2 capsules by mouth 2 (two) times daily. Takes two capsules in the morning and one capsule at night.   Yes Historical Provider, MD  POTASSIUM PO Take 1  tablet by mouth daily.   Yes Historical Provider, MD  simvastatin (ZOCOR) 10 MG tablet Take 10 mg by mouth at bedtime.   Yes Historical Provider, MD  lisinopril (PRINIVIL,ZESTRIL) 5 MG tablet Take 1 tablet (5 mg total) by mouth daily. 08/06/10 08/06/11  Peter M Martinique, MD   BP 101/79 mmHg  Pulse 73  Temp(Src) 98.1 F (36.7 C) (Oral)  Resp 16  Ht 5\' 8"  (1.727 m)  Wt 163 lb 2.3 oz (74 kg)  BMI 24.81 kg/m2  SpO2 100% Physical Exam  Constitutional: She is oriented to person, place, and time. She appears well-developed and well-nourished. No distress.  HENT:  Head: Normocephalic and atraumatic.  Eyes: EOM are normal. Pupils are equal, round, and reactive to light.  Neck: Normal range of motion. Neck supple.  Cardiovascular: Normal rate and regular rhythm.  Exam reveals no gallop and no friction rub.   No murmur heard. Pulmonary/Chest: Effort normal. She has no wheezes. She has no rales.  Abdominal: Soft. She exhibits no distension. There is tenderness (mild LLQ ttp). There is no rebound.  Genitourinary: Guaiac positive stool.  Grossly bloody rectal exam  Musculoskeletal: She exhibits no edema or tenderness.  Neurological: She is alert and oriented to person, place, and time.  Skin: Skin is warm and dry. She is not diaphoretic.  Psychiatric: She has a normal mood and affect. Her behavior is normal.  Nursing note and vitals reviewed.   ED Course  Procedures (including critical care time) Labs Review Labs Reviewed  COMPREHENSIVE METABOLIC PANEL - Abnormal; Notable for the following:    Glucose, Bld 101 (*)    Total Bilirubin 1.3 (*)    All other components within normal limits  CBC - Abnormal; Notable for the following:    Platelets 123 (*)    All other components within normal limits  URINALYSIS, ROUTINE W REFLEX MICROSCOPIC (NOT AT [facility]) - Abnormal; Notable for the following:    Hgb urine dipstick TRACE (*)    Leukocytes, UA TRACE (*)    All other components within normal limits   URINE MICROSCOPIC-ADD ON - Abnormal; Notable for the following:    Squamous Epithelial / LPF 0-5 (*)    Bacteria, UA RARE (*)    All other components within normal limits  HEMOGLOBIN AND HEMATOCRIT, BLOOD - Abnormal; Notable for the following:    Hemoglobin 10.2 (*)    HCT 30.5 (*)    All other components within normal limits  POC OCCULT BLOOD, ED - Abnormal; Notable for the following:    Fecal Occult Bld POSITIVE (*)    All other components within normal limits  LIPASE, BLOOD  GI PATHOGEN PANEL BY PCR, STOOL  HEMOGLOBIN AND HEMATOCRIT, BLOOD  BASIC METABOLIC PANEL  CBC  TYPE AND SCREEN  ABO/RH  Imaging Review Ct Abdomen Pelvis W Contrast  04/16/2015  CLINICAL DATA:  Left lower quadrant pain beginning after lunch 04/15/2015. Vomiting and diarrhea. Rectal bleeding. Initial encounter. EXAM: CT ABDOMEN AND PELVIS WITH CONTRAST TECHNIQUE: Multidetector CT imaging of the abdomen and pelvis was performed using the standard protocol following bolus administration of intravenous contrast. CONTRAST:  100 mL OMNIPAQUE IOHEXOL 300 MG/ML SOLN, 50 mL OMNIPAQUE IOHEXOL 300 MG/ML SOLN COMPARISON:  CT abdomen and pelvis 01/24/2010. FINDINGS: Mild dependent atelectasis is seen in the lung bases. No pleural or pericardial effusion. Heart size is normal. The gallbladder, liver, spleen, adrenal glands, kidneys, pancreas and biliary tree are unremarkable. There is marked wall thickening throughout the descending colon with pericolonic stranding identified. The colon is otherwise unremarkable. No focal fluid collection, pneumatosis, portal venous gas or free intraperitoneal air is identified. The stomach and small bowel appear normal. There is no lymphadenopathy. The patient is status post hysterectomy. No lymphadenopathy or fluid. Scattered aortoiliac atherosclerosis without aneurysm is noted. No focal bony abnormality is identified. Lower thoracic and lower lumbar spondylosis noted. IMPRESSION: Findings  consistent with colitis of the descending colon. The appearance is most in keeping with infectious or inflammatory colitis. No other acute abnormality. Electronically Signed   By: Inge Rise M.D.   On: 04/16/2015 11:30   I have personally reviewed and evaluated these images and lab results as part of my medical decision-making.   EKG Interpretation None      MDM   Final diagnoses:  BRBPR (bright red blood per rectum)  Colitis, acute    64 yo F with BRBPR.  Grossly bloody on rectal exam, CT scan with colitis.  Likely cause of bleed, will start on cipro/flagyl.  Hgb 12.  No known baseline. Admit.    The patients results and plan were reviewed and discussed.   Any x-rays performed were independently reviewed by myself.   Differential diagnosis were considered with the presenting HPI.  Medications  methocarbamol (ROBAXIN) tablet 500 mg (not administered)  dextrose 5 %-0.45 % sodium chloride infusion ( Intravenous New Bag/Given 04/16/15 1505)  acetaminophen (TYLENOL) tablet 650 mg (not administered)    Or  acetaminophen (TYLENOL) suppository 650 mg (not administered)  oxyCODONE (Oxy IR/ROXICODONE) immediate release tablet 5 mg (not administered)  morphine 2 MG/ML injection 1 mg (not administered)  ondansetron (ZOFRAN) tablet 4 mg (not administered)    Or  ondansetron (ZOFRAN) injection 4 mg (not administered)  ciprofloxacin (CIPRO) IVPB 400 mg (not administered)  metroNIDAZOLE (FLAGYL) IVPB 500 mg (500 mg Intravenous Given 04/16/15 1949)  pantoprazole (PROTONIX) injection 40 mg (40 mg Intravenous Given 04/16/15 1220)  chlorhexidine (PERIDEX) 0.12 % solution 15 mL (15 mLs Mouth Rinse Given 04/16/15 1535)  antiseptic oral rinse (CPC / CETYLPYRIDINIUM CHLORIDE 0.05%) solution 7 mL (7 mLs Mouth Rinse Given 04/16/15 1536)  sodium chloride 0.9 % bolus 1,000 mL (0 mLs Intravenous Stopped 04/16/15 1101)  morphine 2 MG/ML injection 2 mg (2 mg Intravenous Given 04/16/15 1001)   ondansetron (ZOFRAN) injection 4 mg (4 mg Intravenous Given 04/16/15 0959)  iohexol (OMNIPAQUE) 300 MG/ML solution 50 mL (50 mLs Oral Contrast Given 04/16/15 1000)  pantoprazole (PROTONIX) injection 40 mg (40 mg Intravenous Given 04/16/15 1023)  iohexol (OMNIPAQUE) 300 MG/ML solution 100 mL (100 mLs Intravenous Contrast Given 04/16/15 1106)  ciprofloxacin (CIPRO) IVPB 400 mg (400 mg Intravenous Transfusing/Transfer 04/16/15 1254)  metroNIDAZOLE (FLAGYL) IVPB 500 mg (0 mg Intravenous Stopped 04/16/15 1252)    Filed Vitals:   04/16/15 0900 04/16/15  1056 04/16/15 1311  BP: 120/80 130/81 101/79  Pulse: 98 86 73  Temp: 98.2 F (36.8 C)  98.1 F (36.7 C)  TempSrc: Oral  Oral  Resp: 18 16 16   Height:   5\' 8"  (1.727 m)  Weight:   163 lb 2.3 oz (74 kg)  SpO2: 100% 98% 100%    Final diagnoses:  BRBPR (bright red blood per rectum)  Colitis, acute    Admission/ observation were discussed with the admitting physician, patient and/or family and they are comfortable with the plan.      Deno Etienne, DO 04/16/15 2029

## 2015-04-16 NOTE — H&P (Signed)
[facility] History and Physical  MIKINLEY RYLE [id number] DOB: 04-16-1951 DOA: 04/16/2015  Referring physician: Dr Tyrone Nine PCP: Bing Matter, PA-C   Chief Complaint: vomiting, diarrhea, blood in the stool   HPI: Dawn Underwood is a 64 y.o. female with PMH Graves diseases, cystocele, rectocele who presents to the emergency department complaining of nausea vomiting and diarrhea that  started the day prior to admission. Patient report bright red blood, mix  with stool. She reports more than 5 watery stools the day prior to admission. She noticed that her symptoms started after she ate at a [employer] the day prior. She ate hamburger and fries. Husband has mild symptoms. Patient had a near syncope episode the day prior to admission she is started to feel sweats and felt that she was going to pass out.  She denies hematemesis, chest pain, shortness of breath.  Evaluation In the ED: Hb at 12, fecal occult positive, CT abdomen and pelvis: Findings consistent with colitis of the descending colon. The appearance is most in keeping with infectious or inflammatory colitis. No other acute abnormality.   Review of Systems:  Negative, except as per HPI  Past Medical History  Diagnosis Date  . Acute sinusitis, unspecified   . Other malaise and fatigue   . Lower back pain   . Light headedness   . Joint pain   . Hyperlipidemia   . Graves' disease   . Cystocele or rectocele with complete uterine prolapse   . Anemia    Past Surgical History  Procedure Laterality Date  . Cataract extraction      Bil  . Tubal ligation    . Abdominal hysterectomy    . Bladder mesh     Social History:  reports that she has never smoked. She has never used smokeless tobacco. She reports that she drinks about 2.4 oz of alcohol per week. She reports that she does not use illicit drugs.  Allergies  Allergen Reactions  . Erythromycin     Family History  Problem Relation Age of Onset  . Diabetes  Father     Prior to Admission medications   Medication Sig Start Date End Date Taking? Authorizing Provider  aspirin 81 MG tablet Take 81 mg by mouth every other day.    Yes Historical Provider, MD  calcium carbonate 1250 MG capsule Take 1,250 mg by mouth 2 (two) times daily with a meal.   Yes Historical Provider, MD  Coconut Oil 1000 MG CAPS Take 1,000 mg by mouth daily.   Yes Historical Provider, MD  Coenzyme Q10 (CO Q 10) 10 MG CAPS Take 1 capsule by mouth 2 (two) times daily.   Yes Historical Provider, MD  ferrous sulfate 325 (65 FE) MG tablet Take 325 mg by mouth daily.   Yes Historical Provider, MD  fish oil-omega-3 fatty acids 1000 MG capsule Take 1 g by mouth 2 (two) times daily.    Yes Historical Provider, MD  glucosamine-chondroitin 500-400 MG tablet Take 1 tablet by mouth 2 (two) times daily.    Yes Historical Provider, MD  lisinopril (PRINIVIL,ZESTRIL) 10 MG tablet Take 5 mg by mouth daily. 12/06/13  Yes Historical Provider, MD  methocarbamol (ROBAXIN) 500 MG tablet Take 1 tablet by mouth 2 (two) times daily as needed. Muscle spasms. 03/28/15  Yes Historical Provider, MD  naproxen (NAPROSYN) 500 MG tablet Take 500 mg by mouth daily as needed for moderate pain.  09/24/12  Yes Etta Quill, NP  Nutritional Supplements (JOINT FORMULA  PO) Take 1-2 capsules by mouth 2 (two) times daily. Takes two capsules in the morning and one capsule at night.   Yes Historical Provider, MD  POTASSIUM PO Take 1 tablet by mouth daily.   Yes Historical Provider, MD  simvastatin (ZOCOR) 10 MG tablet Take 10 mg by mouth at bedtime.   Yes Historical Provider, MD  lisinopril (PRINIVIL,ZESTRIL) 5 MG tablet Take 1 tablet (5 mg total) by mouth daily. 08/06/10 08/06/11  Peter M Martinique, MD   Physical Exam: Filed Vitals:   04/16/15 0900 04/16/15 1056  BP: 120/80 130/81  Pulse: 98 86  Temp: 98.2 F (36.8 C)   TempSrc: Oral   Resp: 18 16  SpO2: 100% 98%    Wt Readings from Last 3 Encounters:  04/25/13 75.751 kg  (167 lb)  04/12/13 75.841 kg (167 lb 3.2 oz)  09/24/12 73.936 kg (163 lb)    General:  Appears calm and comfortable Eyes: PERRL, normal lids, irises & conjunctiva ENT: grossly normal hearing, lips & tongue Neck: no LAD, masses or thyromegaly Cardiovascular: RRR, no m/r/g. No LE edema. Telemetry: SR, no arrhythmias  Respiratory: CTA bilaterally, no w/r/r. Normal respiratory effort. Abdomen: soft, ntnd Skin: no rash or induration seen on limited exam Musculoskeletal: grossly normal tone BUE/BLE Psychiatric: grossly normal mood and affect, speech fluent and appropriate Neurologic: grossly non-focal.          Labs on Admission:  Basic Metabolic Panel:  Recent Labs Lab 04/16/15 0923  NA 140  K 3.6  CL 104  CO2 26  GLUCOSE 101*  BUN 12  CREATININE 0.75  CALCIUM 9.9   Liver Function Tests:  Recent Labs Lab 04/16/15 0923  AST 25  ALT 16  ALKPHOS 60  BILITOT 1.3*  PROT 8.0  ALBUMIN 4.3    Recent Labs Lab 04/16/15 0923  LIPASE 40   No results for input(s): AMMONIA in the last 168 hours. CBC:  Recent Labs Lab 04/16/15 0923  WBC 9.9  HGB 12.7  HCT 37.5  MCV 87.4  PLT 123*   Cardiac Enzymes: No results for input(s): CKTOTAL, CKMB, CKMBINDEX, TROPONINI in the last 168 hours.  BNP (last 3 results) No results for input(s): BNP in the last 8760 hours.  ProBNP (last 3 results) No results for input(s): PROBNP in the last 8760 hours.  CBG: No results for input(s): GLUCAP in the last 168 hours.  Radiological Exams on Admission: Ct Abdomen Pelvis W Contrast  04/16/2015  CLINICAL DATA:  Left lower quadrant pain beginning after lunch 04/15/2015. Vomiting and diarrhea. Rectal bleeding. Initial encounter. EXAM: CT ABDOMEN AND PELVIS WITH CONTRAST TECHNIQUE: Multidetector CT imaging of the abdomen and pelvis was performed using the standard protocol following bolus administration of intravenous contrast. CONTRAST:  100 mL OMNIPAQUE IOHEXOL 300 MG/ML SOLN, 50 mL  OMNIPAQUE IOHEXOL 300 MG/ML SOLN COMPARISON:  CT abdomen and pelvis 01/24/2010. FINDINGS: Mild dependent atelectasis is seen in the lung bases. No pleural or pericardial effusion. Heart size is normal. The gallbladder, liver, spleen, adrenal glands, kidneys, pancreas and biliary tree are unremarkable. There is marked wall thickening throughout the descending colon with pericolonic stranding identified. The colon is otherwise unremarkable. No focal fluid collection, pneumatosis, portal venous gas or free intraperitoneal air is identified. The stomach and small bowel appear normal. There is no lymphadenopathy. The patient is status post hysterectomy. No lymphadenopathy or fluid. Scattered aortoiliac atherosclerosis without aneurysm is noted. No focal bony abnormality is identified. Lower thoracic and lower lumbar spondylosis noted. IMPRESSION: Findings  consistent with colitis of the descending colon. The appearance is most in keeping with infectious or inflammatory colitis. No other acute abnormality. Electronically Signed   By: Inge Rise M.D.   On: 04/16/2015 11:30    EKG:  None available.   Assessment/Plan Active Problems:   BRBPR (bright red blood per rectum)   Colitis, acute   Hyperlipidemia  1-Colitis: Patient's present with nausea vomiting and bloody stool. CT abdomen showed colitis.  Patient will be admitted to the hospital for IV fluids, IV ciprofloxacin, IV Flagyl.  Check GI pathogen.   2-GI bleed, lower GI bleed. Suspect related to acute colitis. We'll cycle hemoglobin. Blood transfusion as needed. If hemoglobin significantly decreases, might need GI evaluation. IV Protonix.   3-Near-syncope: related to acute illness. Vaso-vagal. Monitor on telemetry. IV fluids.   4-HTN; hold lisinopril, to avoid dehydration in the setting of vomiting and diarrhea. 5-mild thrombocytopenia; suspect related to acute illness. Monitor daily   Code Status: Full code DVT Prophylaxis:SCD no  anticoagulation in setting of bleeding.  Family Communication: Care discussed with multiple family member at bedside Disposition Plan: expect 3 to 4 days inpatient.   Time spent: 75 minutes.   Niel Hummer A [facility] Pager [phone]

## 2015-04-17 DIAGNOSIS — R112 Nausea with vomiting, unspecified: Secondary | ICD-10-CM | POA: Diagnosis present

## 2015-04-17 DIAGNOSIS — K625 Hemorrhage of anus and rectum: Secondary | ICD-10-CM | POA: Diagnosis not present

## 2015-04-17 DIAGNOSIS — Z833 Family history of diabetes mellitus: Secondary | ICD-10-CM | POA: Diagnosis not present

## 2015-04-17 DIAGNOSIS — Z7982 Long term (current) use of aspirin: Secondary | ICD-10-CM | POA: Diagnosis not present

## 2015-04-17 DIAGNOSIS — R55 Syncope and collapse: Secondary | ICD-10-CM | POA: Diagnosis present

## 2015-04-17 DIAGNOSIS — D696 Thrombocytopenia, unspecified: Secondary | ICD-10-CM | POA: Diagnosis present

## 2015-04-17 DIAGNOSIS — E876 Hypokalemia: Secondary | ICD-10-CM | POA: Diagnosis present

## 2015-04-17 DIAGNOSIS — Z79899 Other long term (current) drug therapy: Secondary | ICD-10-CM | POA: Diagnosis not present

## 2015-04-17 DIAGNOSIS — K529 Noninfective gastroenteritis and colitis, unspecified: Secondary | ICD-10-CM | POA: Diagnosis present

## 2015-04-17 DIAGNOSIS — E0500 Thyrotoxicosis with diffuse goiter without thyrotoxic crisis or storm: Secondary | ICD-10-CM | POA: Diagnosis present

## 2015-04-17 DIAGNOSIS — E785 Hyperlipidemia, unspecified: Secondary | ICD-10-CM | POA: Diagnosis present

## 2015-04-17 DIAGNOSIS — Z881 Allergy status to other antibiotic agents status: Secondary | ICD-10-CM | POA: Diagnosis not present

## 2015-04-17 DIAGNOSIS — I10 Essential (primary) hypertension: Secondary | ICD-10-CM | POA: Diagnosis present

## 2015-04-17 LAB — BASIC METABOLIC PANEL
Anion gap: 5 (ref 5–15)
BUN: 7 mg/dL (ref 6–20)
CO2: 26 mmol/L (ref 22–32)
Calcium: 8.4 mg/dL — ABNORMAL LOW (ref 8.9–10.3)
Chloride: 110 mmol/L (ref 101–111)
Creatinine, Ser: 0.76 mg/dL (ref 0.44–1.00)
GFR calc Af Amer: >60 mL/min (ref >60)
GFR calc non Af Amer: >60 mL/min (ref >60)
Glucose, Bld: 105 mg/dL — ABNORMAL HIGH (ref 65–99)
Potassium: 3.4 mmol/L — ABNORMAL LOW (ref 3.5–5.1)
Sodium: 141 mmol/L (ref 135–145)

## 2015-04-17 LAB — CBC
HCT: 30.9 % — ABNORMAL LOW (ref 36.0–46.0)
Hemoglobin: 9.9 g/dL — ABNORMAL LOW (ref 12.0–15.0)
MCH: 28.3 pg (ref 26.0–34.0)
MCHC: 32 g/dL (ref 30.0–36.0)
MCV: 88.3 fL (ref 78.0–100.0)
Platelets: 198 10*3/uL (ref 150–400)
RBC: 3.5 MIL/uL — ABNORMAL LOW (ref 3.87–5.11)
RDW: 13.4 % (ref 11.5–15.5)
WBC: 7.6 10*3/uL (ref 4.0–10.5)

## 2015-04-17 LAB — HEMOGLOBIN AND HEMATOCRIT, BLOOD
HCT: 30.6 % — ABNORMAL LOW (ref 36.0–46.0)
Hemoglobin: 10 g/dL — ABNORMAL LOW (ref 12.0–15.0)

## 2015-04-17 MED ORDER — POTASSIUM CHLORIDE CRYS ER 20 MEQ PO TBCR
40.0000 meq | EXTENDED_RELEASE_TABLET | Freq: Once | ORAL | Status: AC
  Administered 2015-04-17: 40 meq via ORAL
  Filled 2015-04-17: qty 2

## 2015-04-17 NOTE — Progress Notes (Signed)
[facility] PROGRESS NOTE  JANELLEN DOAKES [id number] DOB: [birth year] DOA: 04/16/2015 PCP: Tula Nakayama  Assessment/Plan: Dawn Underwood is a 64 y.o. female with PMH Graves diseases, cystocele, rectocele who presents to the emergency department complaining of nausea vomiting and diarrhea that started the day prior to admission. Patient report bright red blood, mix with stool.   1-Acute Colitis: Patient's present with nausea vomiting and bloody stool. CT abdomen showed colitis.  Continue with IV fluids, IV ciprofloxacin, IV Flagyl.  GI pathogen ordered, not send due to no further diarrhea.   2-GI bleed, lower GI bleed. Suspect related to acute colitis. No further bleeding.  Hb drop to 10, suspect initial hb at 12 was hemoconcentration. Repeat hb to night monitor for bleeding.  If further bleeding might need GI evaluation.  Blood transfusion as needed. IV Protonix.   3-Near-syncope: related to acute illness. Vaso-vagal. Monitor on telemetry. IV fluids.   4-HTN; hold lisinopril, to avoid dehydration in the setting of vomiting and diarrhea.   5-Mild thrombocytopenia; suspect related to acute illness. Resolved.  6-Hypokalemia; replete.   Code Status: Full Code.  Family Communication: care discussed with patient, husband at bedside.  Disposition Plan: remain inpatient to observe Hb    Consultants:  GI  Procedures:  none  Antibiotics:  Cipro  Falgyl  HPI/Subjective: She is feeling better. Abdominal pain improved. No further diarrhea or bleeding.   Objective: Filed Vitals:   04/16/15 2157 04/17/15 0414  BP: 112/69 97/45  Pulse: 83 67  Temp: 97.8 F (36.6 C) 98.5 F (36.9 C)  Resp: 16 18    Intake/Output Summary (Last 24 hours) at 04/17/15 0729 Last data filed at 04/17/15 0700  Gross per 24 hour  Intake 3091.67 ml  Output      0 ml  Net 3091.67 ml   Filed Weights   04/16/15 1311 04/17/15 0414  Weight: 74 kg (163 lb 2.3 oz) 73.4 kg (161  lb 13.1 oz)    Exam:   General:  NAD  Cardiovascular: S 1, S 2 RRR  Respiratory: CTA  Abdomen: BS present, soft, nt  Musculoskeletal: no edema   Data Reviewed: Basic Metabolic Panel:  Recent Labs Lab 04/16/15 0923 04/17/15 0125  NA 140 141  K 3.6 3.4*  CL 104 110  CO2 26 26  GLUCOSE 101* 105*  BUN 12 7  CREATININE 0.75 0.76  CALCIUM 9.9 8.4*   Liver Function Tests:  Recent Labs Lab 04/16/15 0923  AST 25  ALT 16  ALKPHOS 60  BILITOT 1.3*  PROT 8.0  ALBUMIN 4.3    Recent Labs Lab 04/16/15 0923  LIPASE 40   No results for input(s): AMMONIA in the last 168 hours. CBC:  Recent Labs Lab 04/16/15 0923 04/16/15 1806 04/17/15 0125  WBC 9.9  --  7.6  HGB 12.7 10.2* 9.9*  HCT 37.5 30.5* 30.9*  MCV 87.4  --  88.3  PLT 123*  --  198   Cardiac Enzymes: No results for input(s): CKTOTAL, CKMB, CKMBINDEX, TROPONINI in the last 168 hours. BNP (last 3 results) No results for input(s): BNP in the last 8760 hours.  ProBNP (last 3 results) No results for input(s): PROBNP in the last 8760 hours.  CBG: No results for input(s): GLUCAP in the last 168 hours.  No results found for this or any previous visit (from the past 240 hour(s)).   Studies: Ct Abdomen Pelvis W Contrast  04/16/2015  CLINICAL DATA:  Left lower quadrant pain beginning after lunch  04/15/2015. Vomiting and diarrhea. Rectal bleeding. Initial encounter. EXAM: CT ABDOMEN AND PELVIS WITH CONTRAST TECHNIQUE: Multidetector CT imaging of the abdomen and pelvis was performed using the standard protocol following bolus administration of intravenous contrast. CONTRAST:  100 mL OMNIPAQUE IOHEXOL 300 MG/ML SOLN, 50 mL OMNIPAQUE IOHEXOL 300 MG/ML SOLN COMPARISON:  CT abdomen and pelvis 01/24/2010. FINDINGS: Mild dependent atelectasis is seen in the lung bases. No pleural or pericardial effusion. Heart size is normal. The gallbladder, liver, spleen, adrenal glands, kidneys, pancreas and biliary tree are  unremarkable. There is marked wall thickening throughout the descending colon with pericolonic stranding identified. The colon is otherwise unremarkable. No focal fluid collection, pneumatosis, portal venous gas or free intraperitoneal air is identified. The stomach and small bowel appear normal. There is no lymphadenopathy. The patient is status post hysterectomy. No lymphadenopathy or fluid. Scattered aortoiliac atherosclerosis without aneurysm is noted. No focal bony abnormality is identified. Lower thoracic and lower lumbar spondylosis noted. IMPRESSION: Findings consistent with colitis of the descending colon. The appearance is most in keeping with infectious or inflammatory colitis. No other acute abnormality. Electronically Signed   By: Inge Rise M.D.   On: 04/16/2015 11:30    Scheduled Meds: . antiseptic oral rinse  7 mL Mouth Rinse q12n4p  . chlorhexidine  15 mL Mouth Rinse BID  . ciprofloxacin  400 mg Intravenous Q12H  . metronidazole  500 mg Intravenous Q8H  . pantoprazole (PROTONIX) IV  40 mg Intravenous Q12H  . potassium chloride  40 mEq Oral Once   Continuous Infusions: . dextrose 5 % and 0.45% NaCl 100 mL/hr at 04/17/15 0236    Active Problems:   BRBPR (bright red blood per rectum)   Colitis, acute   Hyperlipidemia   Colitis    Time spent: 25 minutes.     Niel Hummer A  [facility] Pager [phone]. If 7PM-7AM, please contact night-coverage at [email or web address], password [facility] 04/17/2015, 7:29 AM

## 2015-04-18 DIAGNOSIS — E785 Hyperlipidemia, unspecified: Secondary | ICD-10-CM

## 2015-04-18 DIAGNOSIS — K625 Hemorrhage of anus and rectum: Secondary | ICD-10-CM

## 2015-04-18 DIAGNOSIS — K529 Noninfective gastroenteritis and colitis, unspecified: Principal | ICD-10-CM

## 2015-04-18 LAB — CBC
HCT: 29.9 % — ABNORMAL LOW (ref 36.0–46.0)
Hemoglobin: 9.9 g/dL — ABNORMAL LOW (ref 12.0–15.0)
MCH: 28.9 pg (ref 26.0–34.0)
MCHC: 33.1 g/dL (ref 30.0–36.0)
MCV: 87.2 fL (ref 78.0–100.0)
PLATELETS: 177 10*3/uL (ref 150–400)
RBC: 3.43 MIL/uL — ABNORMAL LOW (ref 3.87–5.11)
RDW: 13.2 % (ref 11.5–15.5)
WBC: 6.8 10*3/uL (ref 4.0–10.5)

## 2015-04-18 LAB — BASIC METABOLIC PANEL
ANION GAP: 5 (ref 5–15)
BUN: <5 mg/dL — ABNORMAL LOW (ref 6–20)
CALCIUM: 8.6 mg/dL — AB (ref 8.9–10.3)
CO2: 25 mmol/L (ref 22–32)
Chloride: 107 mmol/L (ref 101–111)
Creatinine, Ser: 0.72 mg/dL (ref 0.44–1.00)
Glucose, Bld: 111 mg/dL — ABNORMAL HIGH (ref 65–99)
POTASSIUM: 3.6 mmol/L (ref 3.5–5.1)
SODIUM: 137 mmol/L (ref 135–145)

## 2015-04-18 MED ORDER — PANTOPRAZOLE SODIUM 40 MG PO TBEC
40.0000 mg | DELAYED_RELEASE_TABLET | Freq: Every day | ORAL | Status: DC
  Administered 2015-04-18: 40 mg via ORAL
  Filled 2015-04-18: qty 1

## 2015-04-20 NOTE — Discharge Summary (Signed)
Physician Discharge Summary  Dawn Underwood [id number] DOB: Feb 28, 1951 DOA: 04/16/2015  PCP: Tula Nakayama  Admit date: 04/16/2015 Discharge date: 04/18/2015  Time spent: 45 minutes  Recommendations for Outpatient Follow-up:  1. PCP in 1 week Bing Matter on 12/21 2. FU with Gi in 1 month, eval for colonoscopy   Discharge Diagnoses:  Active Problems:   BRBPR (bright red blood per rectum)   Colitis, acute   Hyperlipidemia    Discharge Condition: stable  Diet recommendation: soft bland diet advance as tolerated  Filed Weights   04/16/15 1311 04/17/15 0414 04/18/15 0516  Weight: 74 kg (163 lb 2.3 oz) 73.4 kg (161 lb 13.1 oz) 73.3 kg (161 lb 9.6 oz)    History of present illness:  Chief Complaint: vomiting, diarrhea, blood in the stool  HPI: Dawn Underwood is a 64 y.o. female with PMH Graves diseases, cystocele, rectocele who presented to the emergency department complaining of nausea vomiting and diarrhea that started the day prior to admission. Patient report bright red blood, mix with stool. She reported more than 5 watery stools the day prior to admission. She noticed that her symptoms started after she ate at a [employer] the day prior. She ate hamburger and fries.  Evaluation In the ED: Hb at 12, fecal occult positive, CT abdomen and pelvis: Findings consistent with colitis of the descending colon. The appearance is most in keeping with infectious or inflammatory colitis. No other acute abnormality.   Hospital Course:  1-Acute Colitis: Patient's present with nausea vomiting and bloody stool. CT abdomen showed colitis.  -treated with supportive care,  IV fluids, IV ciprofloxacin, IV Flagyl.  GI pathogen ordered, not sent due to no further diarrhea.  -clinically improved, diet advanced and discharged home in a stable condition  2-GI bleed, lower GI bleed. Suspect related to acute colitis. No further bleeding.  Hb drop to 10, suspect initial hb at 12  was hemoconcentration. Hb stable since -advised to FU with GI in 1 month  3-Near-syncope: likley Vaso-vagal vs due to dehydration, no events on tele, improved with IVF   4-HTN; held lisinopril on admission, then resumed   5-Mild thrombocytopenia; suspect related to acute illness. Resolved.  6-Hypokalemia; replete.   Discharge Exam: Filed Vitals:   04/17/15 2024 04/18/15 0516  BP: 110/74 102/62  Pulse: 69 67  Temp: 98 F (36.7 C) 98.1 F (36.7 C)  Resp: 20 18    General: AAOx3 Cardiovascular: S1S2/RRR Respiratory: CTAB  Discharge Instructions   Discharge Instructions    Discharge instructions    Complete by:  As directed   Soft Bland diet advance as tolerated     Increase activity slowly    Complete by:  As directed           Discharge Medication List as of 04/18/2015  3:23 PM    CONTINUE these medications which have NOT CHANGED   Details  aspirin 81 MG tablet Take 81 mg by mouth every other day. , Until Discontinued, Historical Med    calcium carbonate 1250 MG capsule Take 1,250 mg by mouth 2 (two) times daily with a meal., Until Discontinued, Historical Med    Coconut Oil 1000 MG CAPS Take 1,000 mg by mouth daily., Until Discontinued, Historical Med    Coenzyme Q10 (CO Q 10) 10 MG CAPS Take 1 capsule by mouth 2 (two) times daily., Until Discontinued, Historical Med    ferrous sulfate 325 (65 FE) MG tablet Take 325 mg by mouth daily., Until Discontinued,  Historical Med    fish oil-omega-3 fatty acids 1000 MG capsule Take 1 g by mouth 2 (two) times daily. , Until Discontinued, Historical Med    glucosamine-chondroitin 500-400 MG tablet Take 1 tablet by mouth 2 (two) times daily. , Until Discontinued, Historical Med    lisinopril (PRINIVIL,ZESTRIL) 10 MG tablet Take 5 mg by mouth daily., Starting 12/06/2013, Until Discontinued, Historical Med    methocarbamol (ROBAXIN) 500 MG tablet Take 1 tablet by mouth 2 (two) times daily as needed. Muscle spasms., Starting  03/28/2015, Until Discontinued, Historical Med    naproxen (NAPROSYN) 500 MG tablet Take 500 mg by mouth daily as needed for moderate pain. , Starting 09/24/2012, Until Discontinued, Historical Med    Nutritional Supplements (JOINT FORMULA PO) Take 1-2 capsules by mouth 2 (two) times daily. Takes two capsules in the morning and one capsule at night., Until Discontinued, Historical Med    simvastatin (ZOCOR) 10 MG tablet Take 10 mg by mouth at bedtime., Until Discontinued, Historical Med      STOP taking these medications     POTASSIUM PO        Allergies  Allergen Reactions  . Erythromycin    Follow-up Information    Follow up with KAPLAN,KRISTEN, PA-C. Go on 04/25/2015.   Specialty:  Family Medicine   Why:  at  11:30am  For Post Hospitalization Follow Up, Arrive 15 minutes prior to appointment, Take your Discharge Instruction to your appt, Bring a list of medications or bottles.   Contact information:   [address] [place] [phone]        The results of significant diagnostics from this hospitalization (including imaging, microbiology, ancillary and laboratory) are listed below for reference.    Significant Diagnostic Studies: Ct Abdomen Pelvis W Contrast  04/16/2015  CLINICAL DATA:  Left lower quadrant pain beginning after lunch 04/15/2015. Vomiting and diarrhea. Rectal bleeding. Initial encounter. EXAM: CT ABDOMEN AND PELVIS WITH CONTRAST TECHNIQUE: Multidetector CT imaging of the abdomen and pelvis was performed using the standard protocol following bolus administration of intravenous contrast. CONTRAST:  100 mL OMNIPAQUE IOHEXOL 300 MG/ML SOLN, 50 mL OMNIPAQUE IOHEXOL 300 MG/ML SOLN COMPARISON:  CT abdomen and pelvis 01/24/2010. FINDINGS: Mild dependent atelectasis is seen in the lung bases. No pleural or pericardial effusion. Heart size is normal. The gallbladder, liver, spleen, adrenal glands, kidneys, pancreas and biliary tree are unremarkable.  There is marked wall thickening throughout the descending colon with pericolonic stranding identified. The colon is otherwise unremarkable. No focal fluid collection, pneumatosis, portal venous gas or free intraperitoneal air is identified. The stomach and small bowel appear normal. There is no lymphadenopathy. The patient is status post hysterectomy. No lymphadenopathy or fluid. Scattered aortoiliac atherosclerosis without aneurysm is noted. No focal bony abnormality is identified. Lower thoracic and lower lumbar spondylosis noted. IMPRESSION: Findings consistent with colitis of the descending colon. The appearance is most in keeping with infectious or inflammatory colitis. No other acute abnormality. Electronically Signed   By: Inge Rise M.D.   On: 04/16/2015 11:30    Microbiology: No results found for this or any previous visit (from the past 240 hour(s)).   Labs: Basic Metabolic Panel:  Recent Labs Lab 04/16/15 0923 04/17/15 0125 04/18/15 0420  NA 140 141 137  K 3.6 3.4* 3.6  CL 104 110 107  CO2 26 26 25   GLUCOSE 101* 105* 111*  BUN 12 7 <5*  CREATININE 0.75 0.76 0.72  CALCIUM 9.9 8.4* 8.6*   Liver  Function Tests:  Recent Labs Lab 04/16/15 0923  AST 25  ALT 16  ALKPHOS 60  BILITOT 1.3*  PROT 8.0  ALBUMIN 4.3    Recent Labs Lab 04/16/15 0923  LIPASE 40   No results for input(s): AMMONIA in the last 168 hours. CBC:  Recent Labs Lab 04/16/15 0923 04/16/15 1806 04/17/15 0125 04/17/15 1732 04/18/15 0420  WBC 9.9  --  7.6  --  6.8  HGB 12.7 10.2* 9.9* 10.0* 9.9*  HCT 37.5 30.5* 30.9* 30.6* 29.9*  MCV 87.4  --  88.3  --  87.2  PLT 123*  --  198  --  177   Cardiac Enzymes: No results for input(s): CKTOTAL, CKMB, CKMBINDEX, TROPONINI in the last 168 hours. BNP: BNP (last 3 results) No results for input(s): BNP in the last 8760 hours.  ProBNP (last 3 results) No results for input(s): PROBNP in the last 8760 hours.  CBG: No results for input(s):  GLUCAP in the last 168 hours.     SignedDomenic Polite  [facility] 04/20/2015, 4:31 PM

## 2015-05-17 ENCOUNTER — Ambulatory Visit (INDEPENDENT_AMBULATORY_CARE_PROVIDER_SITE_OTHER): Payer: Medicare HMO

## 2015-05-17 DIAGNOSIS — Z1231 Encounter for screening mammogram for malignant neoplasm of breast: Secondary | ICD-10-CM | POA: Diagnosis not present

## 2015-06-28 ENCOUNTER — Emergency Department (HOSPITAL_COMMUNITY)
Admission: EM | Admit: 2015-06-28 | Discharge: 2015-06-28 | Disposition: A | Discharge status: Home / Self Care | Payer: Medicare HMO | Attending: Emergency Medicine | Admitting: Emergency Medicine

## 2015-06-28 ENCOUNTER — Encounter (HOSPITAL_COMMUNITY): Payer: Self-pay | Admitting: *Deleted

## 2015-06-28 DIAGNOSIS — Z8742 Personal history of other diseases of the female genital tract: Secondary | ICD-10-CM | POA: Diagnosis not present

## 2015-06-28 DIAGNOSIS — D649 Anemia, unspecified: Secondary | ICD-10-CM | POA: Diagnosis not present

## 2015-06-28 DIAGNOSIS — E785 Hyperlipidemia, unspecified: Secondary | ICD-10-CM | POA: Diagnosis not present

## 2015-06-28 DIAGNOSIS — R5383 Other fatigue: Secondary | ICD-10-CM | POA: Diagnosis present

## 2015-06-28 DIAGNOSIS — I10 Essential (primary) hypertension: Secondary | ICD-10-CM | POA: Insufficient documentation

## 2015-06-28 DIAGNOSIS — Z8709 Personal history of other diseases of the respiratory system: Secondary | ICD-10-CM | POA: Insufficient documentation

## 2015-06-28 DIAGNOSIS — Z7982 Long term (current) use of aspirin: Secondary | ICD-10-CM | POA: Insufficient documentation

## 2015-06-28 DIAGNOSIS — Z79899 Other long term (current) drug therapy: Secondary | ICD-10-CM | POA: Insufficient documentation

## 2015-06-28 LAB — BASIC METABOLIC PANEL
Anion gap: 11 (ref 5–15)
BUN: 19 mg/dL (ref 6–20)
CHLORIDE: 106 mmol/L (ref 101–111)
CO2: 23 mmol/L (ref 22–32)
CREATININE: 0.77 mg/dL (ref 0.44–1.00)
Calcium: 9.2 mg/dL (ref 8.9–10.3)
GFR calc Af Amer: >60 mL/min (ref >60)
GFR calc non Af Amer: >60 mL/min (ref >60)
GLUCOSE: 95 mg/dL (ref 65–99)
Potassium: 3.9 mmol/L (ref 3.5–5.1)
SODIUM: 140 mmol/L (ref 135–145)

## 2015-06-28 LAB — CBC WITH DIFFERENTIAL/PLATELET
Basophils Absolute: 0 10*3/uL (ref 0.0–0.1)
Basophils Relative: 1 %
EOS ABS: 0.1 10*3/uL (ref 0.0–0.7)
EOS PCT: 2 %
HCT: 36 % (ref 36.0–46.0)
HEMOGLOBIN: 11.8 g/dL — AB (ref 12.0–15.0)
LYMPHS ABS: 1.8 10*3/uL (ref 0.7–4.0)
Lymphocytes Relative: 36 %
MCH: 28.6 pg (ref 26.0–34.0)
MCHC: 32.8 g/dL (ref 30.0–36.0)
MCV: 87.4 fL (ref 78.0–100.0)
MONOS PCT: 10 %
Monocytes Absolute: 0.5 10*3/uL (ref 0.1–1.0)
Neutro Abs: 2.5 10*3/uL (ref 1.7–7.7)
Neutrophils Relative %: 51 %
Platelets: 227 10*3/uL (ref 150–400)
RBC: 4.12 MIL/uL (ref 3.87–5.11)
RDW: 13.1 % (ref 11.5–15.5)
WBC: 4.9 10*3/uL (ref 4.0–10.5)

## 2015-06-28 LAB — TROPONIN I: Troponin I: <0.03 ng/mL (ref <0.031)

## 2015-06-28 NOTE — ED Provider Notes (Signed)
CSN: [id number]     Arrival date & time 06/28/15  1351 History   First MD Initiated Contact with Patient 06/28/15 1749     Chief Complaint  Patient presents with  . Hypertension  . Numbness     (Consider location/radiation/quality/duration/timing/severity/associated sxs/prior Treatment) Patient is a 65 y.o. female presenting with hypertension. The history is provided by the patient. No language interpreter was used.  Hypertension This is a recurrent problem. The current episode started today. The problem occurs constantly. The problem has been unchanged. Pertinent negatives include no nausea or vomiting. Nothing aggravates the symptoms. She has tried nothing for the symptoms. The treatment provided no relief.  Pt reports she feels fatigued.  Pt recently was seen in [place] ED. Pt had a low tsh.   Pt just had tsh checked in her Md's office and it was normal.   Md is planning to send her to an Endrocrinologist.  Pt has had Graves disease 20 years ago.  Pt was given inderal at ED because she felt like her heart was racing.  Pt is currently taking lisinopril for her blood pressure.  Pt has been taking one or the other.   Past Medical History  Diagnosis Date  . Acute sinusitis, unspecified   . Other malaise and fatigue   . Lower back pain   . Light headedness   . Joint pain   . Hyperlipidemia   . Graves' disease   . Cystocele or rectocele with complete uterine prolapse   . Anemia    Past Surgical History  Procedure Laterality Date  . Cataract extraction      Bil  . Tubal ligation    . Abdominal hysterectomy    . Bladder mesh     Family History  Problem Relation Age of Onset  . Diabetes Father    Social History  Substance Use Topics  . Smoking status: Never Smoker   . Smokeless tobacco: Never Used  . Alcohol Use: 2.4 oz/week    4 Glasses of wine per week     Comment: socially on weekends   OB History    No data available     Review of Systems  Gastrointestinal: Negative  for nausea and vomiting.  All other systems reviewed and are negative.     Allergies  Erythromycin  Home Medications   Prior to Admission medications   Medication Sig Start Date End Date Taking? Authorizing Provider  aspirin 81 MG tablet Take 81 mg by mouth every other day.    Yes Historical Provider, MD  calcium carbonate 1250 MG capsule Take 1,250 mg by mouth 2 (two) times daily with a meal.   Yes Historical Provider, MD  Coconut Oil 1000 MG CAPS Take 1,000 mg by mouth daily.   Yes Historical Provider, MD  Coenzyme Q10 (CO Q 10) 10 MG CAPS Take 1 capsule by mouth 2 (two) times daily.   Yes Historical Provider, MD  escitalopram (LEXAPRO) 10 MG tablet Take 10 mg by mouth daily.  06/22/15 09/20/15 Yes Historical Provider, MD  ferrous sulfate 325 (65 FE) MG tablet Take 325 mg by mouth daily.   Yes Historical Provider, MD  fish oil-omega-3 fatty acids 1000 MG capsule Take 1 g by mouth 2 (two) times daily.    Yes Historical Provider, MD  glucosamine-chondroitin 500-400 MG tablet Take 1 tablet by mouth 2 (two) times daily.    Yes Historical Provider, MD  potassium chloride (K-DUR) 10 MEQ tablet Take 10 mEq by mouth daily.  Yes Historical Provider, MD  propranolol (INDERAL) 10 MG tablet Take 10 mg by mouth daily.  06/25/15  Yes Historical Provider, MD  simvastatin (ZOCOR) 10 MG tablet Take 10 mg by mouth at bedtime.   Yes Historical Provider, MD  lisinopril (PRINIVIL,ZESTRIL) 10 MG tablet Take 5 mg by mouth daily. 12/06/13   Historical Provider, MD   BP 128/75 mmHg  Pulse 65  Temp(Src) 97.7 F (36.5 C) (Oral)  Resp 18  SpO2 100% Physical Exam  Constitutional: She is oriented to person, place, and time. She appears well-developed and well-nourished.  HENT:  Head: Normocephalic and atraumatic.  Eyes: Conjunctivae and EOM are normal. Pupils are equal, round, and reactive to light.  Neck: Normal range of motion. Neck supple.  Cardiovascular: Normal rate.   Pulmonary/Chest: Effort normal.   Abdominal: Soft.  Musculoskeletal: Normal range of motion.  Neurological: She is alert and oriented to person, place, and time. She has normal reflexes.  Skin: Skin is warm.  Psychiatric: She has a normal mood and affect.  Nursing note and vitals reviewed.   ED Course  Procedures (including critical care time) Labs Review Labs Reviewed  CBC WITH DIFFERENTIAL/PLATELET - Abnormal; Notable for the following:    Hemoglobin 11.8 (*)    All other components within normal limits  TROPONIN I  BASIC METABOLIC PANEL    Imaging Review No results found. I have personally reviewed and evaluated these images and lab results as part of my medical decision-making.   EKG Interpretation   Date/Time:  Thursday June 28 2015 20:18:07 EST Ventricular Rate:  60 PR Interval:  139 QRS Duration: 101 QT Interval:  450 QTC Calculation: 450 R Axis:   53 Text Interpretation:  Sinus rhythm Confirmed by Hazle Coca [phone] on  06/28/2015 8:58:15 PM      MDM   Final diagnoses:  Other fatigue    An After Visit Summary was printed and given to the patient.   Dawn Meadow, PA-C 06/28/15 2118  Dawn Reichert, MD 06/30/15 [phone]

## 2015-06-28 NOTE — ED Notes (Signed)
Pt presents after an anxiety attack x 5 days ago and intermittent bilateral hand tingling and R shoulder pain starting this morning.  All symptoms have resolved.  Pt believes that it has to do w/ her blood pressure.  Sts she was prescribed propanolol x 5 ago, but she thinks it made her BP drop too low.  Pt reports weakness since being admitted for food poisoning in December 2016.  Pt sts "my heart has stopped in the past and I knew it did, because I could feel exactly where my heart is."  Pt has checked BP numerous times today.          Pt reports that her thyroid level was low x 5 days ago and she is trying to get into her PCP.

## 2015-06-28 NOTE — Discharge Instructions (Signed)
Fatigue  Fatigue is feeling tired all of the time, a lack of energy, or a lack of motivation. Occasional or mild fatigue is often a normal response to activity or life in general. However, long-lasting (chronic) or extreme fatigue may indicate an underlying medical condition.  HOME CARE INSTRUCTIONS   Watch your fatigue for any changes. The following actions may help to lessen any discomfort you are feeling:  · Talk to your health care provider about how much sleep you need each night. Try to get the required amount every night.  · Take medicines only as directed by your health care provider.  · Eat a healthy and nutritious diet. Ask your health care provider if you need help changing your diet.  · Drink enough fluid to keep your urine clear or pale yellow.  · Practice ways of relaxing, such as yoga, meditation, massage therapy, or acupuncture.  · Exercise regularly.    · Change situations that cause you stress. Try to keep your work and personal routine reasonable.  · Do not abuse illegal drugs.  · Limit alcohol intake to no more than 1 drink per day for nonpregnant women and 2 drinks per day for men. One drink equals 12 ounces of beer, 5 ounces of wine, or 1½ ounces of hard liquor.  · Take a multivitamin, if directed by your health care provider.  SEEK MEDICAL CARE IF:   · Your fatigue does not get better.  · You have a fever.    · You have unintentional weight loss or gain.  · You have headaches.    · You have difficulty:      Falling asleep.    Sleeping throughout the night.  · You feel angry, guilty, anxious, or sad.     · You are unable to have a bowel movement (constipation).    · You skin is dry.     · Your legs or another part of your body is swollen.    SEEK IMMEDIATE MEDICAL CARE IF:   · You feel confused.    · Your vision is blurry.  · You feel faint or pass out.    · You have a severe headache.    · You have severe abdominal, pelvic, or back pain.    · You have chest pain, shortness of breath, or an  irregular or fast heartbeat.    · You are unable to urinate or you urinate less than normal.    · You develop abnormal bleeding, such as bleeding from the rectum, vagina, nose, lungs, or nipples.  · You vomit blood.     · You have thoughts about harming yourself or committing suicide.    · You are worried that you might harm someone else.       This information is not intended to replace advice given to you by your health care provider. Make sure you discuss any questions you have with your health care provider.     Document Released: 02/16/2007 Document Revised: 05/12/2014 Document Reviewed: 08/23/2013  Elsevier Interactive Patient Education ©2016 [employer]

## 2015-06-28 NOTE — ED Notes (Addendum)
Pt states she is concerned about hypertension, numbness in her right arm and shoulder pain at 117PM today. Pt states her shoulder no longer hurts and right arm is no longer numb. Pt states she became concerned after her hand began tingling today. Pt states her blood pressure was 146/98. Pt was told she was having an anxiety attack on Saturday due to a racing heart rate. Pt states she was put on a beta blocker Saturday and was also told her thyroid hormone was low.

## 2015-08-23 ENCOUNTER — Ambulatory Visit (INDEPENDENT_AMBULATORY_CARE_PROVIDER_SITE_OTHER): Payer: Medicare HMO | Admitting: Internal Medicine

## 2015-08-23 ENCOUNTER — Encounter: Payer: Self-pay | Admitting: Internal Medicine

## 2015-08-23 VITALS — BP 110/82 | HR 86 | Ht 68.0 in | Wt 158.2 lb

## 2015-08-23 DIAGNOSIS — R002 Palpitations: Secondary | ICD-10-CM

## 2015-08-23 DIAGNOSIS — E785 Hyperlipidemia, unspecified: Secondary | ICD-10-CM | POA: Diagnosis not present

## 2015-08-23 DIAGNOSIS — R079 Chest pain, unspecified: Secondary | ICD-10-CM | POA: Diagnosis not present

## 2015-08-23 DIAGNOSIS — R0789 Other chest pain: Secondary | ICD-10-CM

## 2015-08-23 DIAGNOSIS — R0609 Other forms of dyspnea: Secondary | ICD-10-CM

## 2015-08-23 NOTE — Progress Notes (Signed)
OFFICE NOTE  Chief Complaint:  Palpitations, chest pain, anxiety, dyspnea  Primary Care Physician: Tula Nakayama  HPI:  Dawn Underwood is a 65 y.o. female who is currently referred to me for evaluation of palpitations. She reported that she's had palpitations on and off for years however recently had worsening symptoms after a bout of colitis with bright red blood per rectum. This ultimately resolved. She then had problems regulating her thyroid was referred to Dr. Dagmar Hait for further evaluation. She does have a history of graves disease. There is also underlying anxiety and of course palpitations in the past. Family history significant for some coronary disease in her grandparents and she has dyslipidemia and questionable hypertension (she's had both hypotension and hypertension at times, and may have white coat hypertension). In February she was taken the emergency department after an episode where she fell of flushing feeling over her body as well as her heart pounding out of her chest. She became very anxious about this and resented to the emergency department. She said she waited in the ER for several hours and her symptoms resolved prior to being completely evaluated. Ultimately she was told that this was a "panic attack". Since then she's had no other significant episodes but several other smaller episodes of palpitations. She also reports some left shoulder pain which is sharp and may be related to these palpitations. In addition there is some mild shortness of breath.  PMHx:  Past Medical History  Diagnosis Date  . Acute sinusitis, unspecified   . Other malaise and fatigue   . Lower back pain   . Light headedness   . Joint pain   . Hyperlipidemia   . Graves' disease   . Cystocele or rectocele with complete uterine prolapse   . Anemia     Past Surgical History  Procedure Laterality Date  . Cataract extraction      Bil  . Tubal ligation    . Abdominal hysterectomy     . Bladder mesh      FAMHx:  Family History  Problem Relation Age of Onset  . Diabetes Father     SOCHx:   reports that she has never smoked. She has never used smokeless tobacco. She reports that she drinks about 2.4 oz of alcohol per week. She reports that she does not use illicit drugs.  ALLERGIES:  Allergies  Allergen Reactions  . Erythromycin     ROS: Pertinent items noted in HPI and remainder of comprehensive ROS otherwise negative.  HOME MEDS: Current Outpatient Prescriptions  Medication Sig Dispense Refill  . ALPRAZolam (XANAX) 0.25 MG tablet Take 0.25 mg by mouth at bedtime as needed for anxiety.    . Ascorbic Acid (VITAMIN C) 1000 MG tablet Take 1,000 mg by mouth daily.    Marland Kitchen aspirin 81 MG tablet Take 81 mg by mouth every other day.     . calcium citrate-vitamin D (CITRACAL+D) 315-200 MG-UNIT tablet Take 1 tablet by mouth 2 (two) times daily. Take 1 tab by mouth in the morning and tabs in the evening    . Coconut Oil 1000 MG CAPS Take 1,000 mg by mouth daily.    . Coenzyme Q10 (CO Q 10) 10 MG CAPS Take 1 capsule by mouth 2 (two) times daily.    . Cyanocobalamin (B-12) 2500 MCG SUBL Place 1 each under the tongue daily.    Marland Kitchen docusate sodium (COLACE) 100 MG capsule Take 100 mg by mouth daily.    Marland Kitchen  ferrous sulfate 325 (65 FE) MG tablet Take 325 mg by mouth daily.    . fish oil-omega-3 fatty acids 1000 MG capsule Take 1 g by mouth 2 (two) times daily.     . Glucosamine-Chondroit-Vit C-Mn (GLUCOSAMINE 1500 COMPLEX PO) Take 1 tablet by mouth 2 (two) times daily.    Marland Kitchen lisinopril (PRINIVIL,ZESTRIL) 10 MG tablet Take 5 mg by mouth daily.    . potassium chloride (K-DUR) 10 MEQ tablet Take 10 mEq by mouth daily.     . simvastatin (ZOCOR) 10 MG tablet Take 10 mg by mouth at bedtime.     No current facility-administered medications for this visit.    LABS/IMAGING: No results found for this or any previous visit (from the past 48 hour(s)). No results found.  WEIGHTS: Wt  Readings from Last 3 Encounters:  08/23/15 158 lb 4 oz (71.782 kg)  04/18/15 161 lb 9.6 oz (73.3 kg)  04/25/13 167 lb (75.751 kg)    VITALS: BP 110/82 mmHg  Pulse 86  Ht 5\' 8"  (1.727 m)  Wt 158 lb 4 oz (71.782 kg)  BMI 24.07 kg/m2  EXAM: General appearance: alert and no distress Neck: no carotid bruit and no JVD Lungs: clear to auscultation bilaterally Heart: regular rate and rhythm, S1, S2 normal, no murmur, click, rub or gallop Abdomen: soft, non-tender; bowel sounds normal; no masses,  no organomegaly Extremities: extremities normal, atraumatic, no cyanosis or edema Pulses: 2+ and symmetric Skin: Skin color, texture, turgor normal. No rashes or lesions Neurologic: Grossly normal Psych: Moderately anxious  EKG: Normal sinus rhythm at 86, rightward axis  ASSESSMENT: 1. Symptomatically palpitations 2. Mild dyspnea and atypical chest pain 3. 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 Possible hypertension  PLAN: 1.   Mrs. which are has had significant palpitations recently for which she felt like her heart might stop. There is associated anxiety. She's also had atypical chest discomfort and mild dyspnea. She does have thyroid disease however that seems to be appropriately regulated. She just saw Dr. Buddy Duty who made no changes to her regimen. I recommend a treadmill exercise stress test to evaluate for ischemia, an echocardiogram to look for functional or structural abnormalities the heart, and also place a 30 day monitor to pick up on any significant palpitations she may be having. Is not clear to me if she clearly has hypertension or not. She says in the past she's been on high doses of lisinopril and was orthostatic and hypotensive, and does seem to feel better off of medicine but that is told that she is hypertensive and placed back on it. This could be white coat hypertension, especially given her anxiety. Ultimately, we may discontinue her lisinopril in favor of a  low-dose beta blocker depending on the findings of her monitor.  Thanks for the kind referral. Plan to see her back to discuss her studies in a few weeks.  Pixie Casino, MD, [facility] Attending Cardiologist Wainaku C Hilty 08/23/2015, 4:12 PM

## 2015-08-23 NOTE — Patient Instructions (Signed)
Your physician has requested that you have an echocardiogram @ [address] - [address]. Echocardiography is a painless test that uses sound waves to create images of your heart. It provides your doctor with information about the size and shape of your heart and how well your heart's chambers and valves are working. This procedure takes approximately one hour. There are no restrictions for this procedure.  Your physician has requested that you have an exercise tolerance test. For further information please visit [email or web address]. Please also follow instruction sheet, as given.  Your physician has recommended that you wear an event monitor for 30 days. Event monitors are medical devices that record the heart's electrical activity. Doctors most often [place] these monitors to diagnose arrhythmias. Arrhythmias are problems with the speed or rhythm of the heartbeat. The monitor is a small, portable device. You can wear one while you do your normal daily activities. This is usually used to diagnose what is causing palpitations/syncope (passing out).  Your physician recommends that you schedule a follow-up appointment after your testing.

## 2015-08-29 ENCOUNTER — Ambulatory Visit: Payer: Medicare HMO | Admitting: Cardiology

## 2015-09-04 ENCOUNTER — Telehealth (HOSPITAL_COMMUNITY): Payer: Self-pay

## 2015-09-04 DIAGNOSIS — R002 Palpitations: Secondary | ICD-10-CM | POA: Diagnosis not present

## 2015-09-04 NOTE — Telephone Encounter (Signed)
Encounter complete. 

## 2015-09-06 ENCOUNTER — Ambulatory Visit (HOSPITAL_COMMUNITY)
Admission: RE | Admit: 2015-09-06 | Discharge: 2015-09-06 | Disposition: A | Discharge status: Home / Self Care | Payer: Medicare HMO | Source: Ambulatory Visit | Attending: Cardiovascular Disease | Admitting: Cardiovascular Disease

## 2015-09-06 DIAGNOSIS — R002 Palpitations: Secondary | ICD-10-CM

## 2015-09-06 DIAGNOSIS — R079 Chest pain, unspecified: Secondary | ICD-10-CM

## 2015-09-06 LAB — EXERCISE TOLERANCE TEST
CSEPHR: 97 %
CSEPPHR: 150 {beats}/min
Estimated workload: 10.1 METS
Exercise duration (min): 8 min
MPHR: 154 {beats}/min
RPE: 16
Rest HR: 86 {beats}/min

## 2015-09-13 ENCOUNTER — Other Ambulatory Visit: Payer: Self-pay

## 2015-09-13 ENCOUNTER — Ambulatory Visit (HOSPITAL_COMMUNITY): Payer: Medicare HMO | Attending: Internal Medicine

## 2015-09-13 DIAGNOSIS — I071 Rheumatic tricuspid insufficiency: Secondary | ICD-10-CM | POA: Insufficient documentation

## 2015-09-13 DIAGNOSIS — I340 Nonrheumatic mitral (valve) insufficiency: Secondary | ICD-10-CM | POA: Diagnosis not present

## 2015-09-13 DIAGNOSIS — I5189 Other ill-defined heart diseases: Secondary | ICD-10-CM | POA: Diagnosis not present

## 2015-09-13 DIAGNOSIS — R002 Palpitations: Secondary | ICD-10-CM | POA: Insufficient documentation

## 2015-09-13 DIAGNOSIS — E785 Hyperlipidemia, unspecified: Secondary | ICD-10-CM | POA: Insufficient documentation

## 2015-09-13 DIAGNOSIS — R079 Chest pain, unspecified: Secondary | ICD-10-CM

## 2015-09-27 ENCOUNTER — Ambulatory Visit (INDEPENDENT_AMBULATORY_CARE_PROVIDER_SITE_OTHER): Payer: Medicare HMO | Admitting: Gastroenterology

## 2015-09-27 ENCOUNTER — Encounter: Payer: Self-pay | Admitting: Gastroenterology

## 2015-09-27 VITALS — BP 106/80 | HR 84 | Ht 68.0 in | Wt 158.6 lb

## 2015-09-27 DIAGNOSIS — R1013 Epigastric pain: Secondary | ICD-10-CM | POA: Diagnosis not present

## 2015-09-27 NOTE — Patient Instructions (Signed)
If you are age 67 or older, your body mass index should be between 23-30. Your Body mass index is 24.12 kg/(m^2). If this is out of the aforementioned range listed, please consider follow up with your Primary Care Provider.  If you are age 45 or younger, your body mass index should be between 19-25. Your Body mass index is 24.12 kg/(m^2). If this is out of the aformentioned range listed, please consider follow up with your Primary Care Provider.   Thank you for choosing [facility]  Dr Wilfrid Lund III

## 2015-09-27 NOTE — Progress Notes (Signed)
[facility] Consult Note:  History: Dawn Underwood 09/27/2015  Referring physician: Tula Underwood  Reason for consult/chief complaint: Abdominal Pain   Subjective HPI:  This patient came to see me for abdominal pain. She reports being acutely sick with vomiting fever and bloody diarrhea causing hospital stay this past December. I reviewed the CT scan report a discharge summary and some labs that appeared to be probable acute infectious colitis. She had left-sided colitis on CT scan, which also could've been consistent with ischemic colitis. Sounds like the symptoms largely resolved, though she had ongoing or appetite fatigue and poor sleep for several months. In mid April she had an episode of chest pressure and upper abdominal pain with palpitations and came to an EGD while on vacation in [place]. She then saw our cardiologist on 08/23/2015, and her workup so far is been negative. Sounds like they suspected it could've been a panic attack. If her, she was given Xanax which she has taken a few times at bedtime. She still has some intermittent chest burning and epigastric burning as well as a feeling of left upper or right upper quadrant "pulling sensation". Altogether, the symptoms are intermittent with no clear triggers or relieving factors and they are largely subsiding since they began a month or so ago. She denies dysphagia, odynophagia, early satiety, vomiting, recent weight loss or nausea. Her last colonoscopy Dr. Olevia Underwood was normal in December 2014.   ROS:  Review of Systems  Constitutional: Negative for appetite change and unexpected weight change.  HENT: Negative for mouth sores and voice change.   Eyes: Negative for pain and redness.  Respiratory: Negative for cough and shortness of breath.   Cardiovascular: Positive for palpitations. Negative for chest pain.  Genitourinary: Negative for dysuria and hematuria.  Musculoskeletal: Negative for myalgias and  arthralgias.  Skin: Negative for pallor and rash.  Neurological: Negative for weakness and headaches.  Hematological: Negative for adenopathy.  Psychiatric/Behavioral: The patient is nervous/anxious.      Past Medical History: Past Medical History  Diagnosis Date  . Acute sinusitis, unspecified   . Other malaise and fatigue   . Lower back pain   . Light headedness   . Joint pain   . Hyperlipidemia   . Graves' disease   . Cystocele or rectocele with complete uterine prolapse   . Anemia      Past Surgical History: Past Surgical History  Procedure Laterality Date  . Cataract extraction      Bil  . Tubal ligation    . Abdominal hysterectomy    . Bladder mesh       Family History: Family History  Problem Relation Age of Onset  . Diabetes Father     Social History: Social History   Social History  . Marital Status: Married    Spouse Name: N/A  . Number of Children: N/A  . Years of Education: N/A   Social History Main Topics  . Smoking status: Never Smoker   . Smokeless tobacco: Never Used  . Alcohol Use: 2.4 oz/week    4 Glasses of wine per week     Comment: socially on weekends  . Drug Use: No  . Sexual Activity: Not Asked   Other Topics Concern  . None   Social History Narrative    Allergies: Allergies  Allergen Reactions  . Erythromycin     Outpatient Meds: Current Outpatient Prescriptions  Medication Sig Dispense Refill  . ALPRAZolam (XANAX) 0.25 MG tablet Take 0.25 mg by  mouth at bedtime as needed for anxiety.    . Ascorbic Acid (VITAMIN C) 1000 MG tablet Take 1,000 mg by mouth daily.    Marland Kitchen aspirin 81 MG tablet Take 81 mg by mouth every other day.     . calcium citrate-vitamin D (CITRACAL+D) 315-200 MG-UNIT tablet Take 1 tablet by mouth 2 (two) times daily. Take 1 tab by mouth in the morning and tabs in the evening    . Coconut Oil 1000 MG CAPS Take 1,000 mg by mouth daily.    . Coenzyme Q10 (CO Q 10) 10 MG CAPS Take 1 capsule by mouth 2  (two) times daily.    . Cyanocobalamin (B-12) 2500 MCG SUBL Place 1 each under the tongue daily.    Marland Kitchen docusate sodium (COLACE) 100 MG capsule Take 100 mg by mouth daily.    . ferrous sulfate 325 (65 FE) MG tablet Take 325 mg by mouth daily.    . fish oil-omega-3 fatty acids 1000 MG capsule Take 1 g by mouth 2 (two) times daily.     . Glucosamine-Chondroit-Vit C-Mn (GLUCOSAMINE 1500 COMPLEX PO) Take 1 tablet by mouth 2 (two) times daily.    Marland Kitchen lisinopril (PRINIVIL,ZESTRIL) 10 MG tablet Take 5 mg by mouth daily.    . potassium chloride (K-DUR) 10 MEQ tablet Take 10 mEq by mouth daily.     . simvastatin (ZOCOR) 10 MG tablet Take 10 mg by mouth at bedtime.     No current facility-administered medications for this visit.      ___________________________________________________________________ Objective  Exam:  BP 106/80 mmHg  Pulse 84  Ht 5\' 8"  (1.727 m)  Wt 158 lb 9.6 oz (71.94 kg)  BMI 24.12 kg/m2 Her husband is also present today  General: this is a(n) well-appearing woman with good muscle mass   Eyes: sclera anicteric, no redness  ENT: oral mucosa moist without lesions, no cervical or supraclavicular lymphadenopathy, good dentition  CV: RRR without murmur, S1/S2, no JVD, no peripheral edema  Resp: clear to auscultation bilaterally, normal RR and effort noted  GI: soft, no tenderness, with active bowel sounds. No guarding or palpable organomegaly noted.  Skin; warm and dry, no rash or jaundice noted  Neuro: awake, alert and oriented x 3. Normal gross motor function and fluent speech  Data: Hospital records from December 2016 were reviewed as noted above.  Assessment: Encounter Diagnosis  Name Primary?  . Dyspepsia Yes    These symptoms are intermittent and nonspecific with no red flag symptoms. I don't think she has a chronic upper digestive disease such as gastritis ulcer or malignancy. I think an upper endoscopy would be of low yield at this point.  Plan:  I  would manage this expectantly, and she will see me as needed if symptoms seem to be worsening.  Thank you for the courtesy of this consult.  Please call me with any questions or concerns.  Dawn Underwood III  CC: Underwood,KRISTEN, PA-C

## 2015-10-02 ENCOUNTER — Encounter: Payer: Self-pay | Admitting: Internal Medicine

## 2015-10-02 ENCOUNTER — Ambulatory Visit (INDEPENDENT_AMBULATORY_CARE_PROVIDER_SITE_OTHER): Payer: Medicare HMO | Admitting: Internal Medicine

## 2015-10-02 VITALS — BP 114/77 | HR 78 | Ht 68.0 in | Wt 158.4 lb

## 2015-10-02 DIAGNOSIS — R0609 Other forms of dyspnea: Secondary | ICD-10-CM

## 2015-10-02 DIAGNOSIS — R002 Palpitations: Secondary | ICD-10-CM | POA: Diagnosis not present

## 2015-10-02 DIAGNOSIS — R0789 Other chest pain: Secondary | ICD-10-CM

## 2015-10-02 MED ORDER — PROPRANOLOL HCL ER 60 MG PO CP24: 60.0000 mg | ORAL_CAPSULE | Freq: Every day | ORAL | Status: DC

## 2015-10-02 NOTE — Patient Instructions (Signed)
STOP lisinopril   START propranolol 60mg  once daily  Your physician recommends that you schedule a follow-up appointment in [place] with Dr. Debara Pickett

## 2015-10-02 NOTE — Progress Notes (Signed)
OFFICE NOTE  Chief Complaint:  Follow-up studies  Primary Care Physician: Tula Nakayama  HPI:  Dawn Underwood is a 65 y.o. female who is currently referred to me for evaluation of palpitations. She reported that she's had palpitations on and off for years however recently had worsening symptoms after a bout of colitis with bright red blood per rectum. This ultimately resolved. She then had problems regulating her thyroid was referred to Dr. Dagmar Hait for further evaluation. She does have a history of graves disease. There is also underlying anxiety and of course palpitations in the past. Family history significant for some coronary disease in her grandparents and she has dyslipidemia and questionable hypertension (she's had both hypotension and hypertension at times, and may have white coat hypertension). In February she was taken the emergency department after an episode where she fell of flushing feeling over her body as well as her heart pounding out of her chest. She became very anxious about this and resented to the emergency department. She said she waited in the ER for several hours and her symptoms resolved prior to being completely evaluated. Ultimately she was told that this was a "panic attack". Since then she's had no other significant episodes but several other smaller episodes of palpitations. She also reports some left shoulder pain which is sharp and may be related to these palpitations. In addition there is some mild shortness of breath.  10/02/15  [name] returns today for follow-up of her studies. She underwent an exercise treadmill stress test which was low risk for ischemia. She had an echocardiogram which showed EF 55-60% with normal systolic function, mild diastolic dysfunction trivial MR and mild TR. She did wear a monitor which demonstrated PVCs, bigeminal PVCs and PACs, likely the cause of her palpitations. She reports that they were particularly bothersome  this weekend however she was under significant stress and anxiety related to following around her grandchildren over the holiday.   PMHx:  Past Medical History  Diagnosis Date  . Acute sinusitis, unspecified   . Other malaise and fatigue   . Lower back pain   . Light headedness   . Joint pain   . Hyperlipidemia   . Graves' disease   . Cystocele or rectocele with complete uterine prolapse   . Anemia     Past Surgical History  Procedure Laterality Date  . Cataract extraction      Bil  . Tubal ligation    . Abdominal hysterectomy    . Bladder mesh      FAMHx:  Family History  Problem Relation Age of Onset  . Diabetes Father     SOCHx:   reports that she has never smoked. She has never used smokeless tobacco. She reports that she drinks about 2.4 oz of alcohol per week. She reports that she does not use illicit drugs.  ALLERGIES:  Allergies  Allergen Reactions  . Erythromycin Other (See Comments)    Pt doesn't remember    ROS: Pertinent items noted in HPI and remainder of comprehensive ROS otherwise negative.  HOME MEDS: Current Outpatient Prescriptions  Medication Sig Dispense Refill  . ALPRAZolam (XANAX) 0.25 MG tablet Take 0.25 mg by mouth at bedtime as needed for anxiety.    . Ascorbic Acid (VITAMIN C) 1000 MG tablet Take 1,000 mg by mouth daily.    Marland Kitchen aspirin 81 MG tablet Take 81 mg by mouth every other day.     . calcium citrate-vitamin D (CITRACAL+D) 315-200  MG-UNIT tablet Take 1 tablet by mouth 2 (two) times daily. Take 1 tab by mouth in the morning and tabs in the evening    . Coconut Oil 1000 MG CAPS Take 1,000 mg by mouth daily.    . Coenzyme Q10 (CO Q 10) 10 MG CAPS Take 1 capsule by mouth 2 (two) times daily.    . Cyanocobalamin (B-12) 2500 MCG SUBL Place 1 each under the tongue daily.    Marland Kitchen docusate sodium (COLACE) 100 MG capsule Take 100 mg by mouth daily.    . ferrous sulfate 325 (65 FE) MG tablet Take 325 mg by mouth daily.    . fish oil-omega-3  fatty acids 1000 MG capsule Take 1 g by mouth 2 (two) times daily.     . Glucosamine-Chondroit-Vit C-Mn (GLUCOSAMINE 1500 COMPLEX PO) Take 1 tablet by mouth 2 (two) times daily.    . potassium chloride (K-DUR) 10 MEQ tablet Take 10 mEq by mouth daily.     . simvastatin (ZOCOR) 10 MG tablet Take 10 mg by mouth at bedtime.    . propranolol ER (INDERAL LA) 60 MG 24 hr capsule Take 1 capsule (60 mg total) by mouth daily. 30 capsule 5   No current facility-administered medications for this visit.    LABS/IMAGING: No results found for this or any previous visit (from the past 48 hour(s)). No results found.  WEIGHTS: Wt Readings from Last 3 Encounters:  10/02/15 158 lb 6.4 oz (71.85 kg)  09/27/15 158 lb 9.6 oz (71.94 kg)  08/23/15 158 lb 4 oz (71.782 kg)    VITALS: BP 114/77 mmHg  Pulse 78  Ht 5\' 8"  (1.727 m)  Wt 158 lb 6.4 oz (71.85 kg)  BMI 24.09 kg/m2  EXAM: Deferred  EKG: Deferred  ASSESSMENT: 1. Symptomatic palpitations - PAC's, PVC's and ventricular bigeminy 2. Mild dyspnea and atypical chest pain 3. 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 Possible hypertension  PLAN: 1.   Mrs. Rouse has been having palpitations and is found to have PACs and PVCs as well as ventricular bigeminy. LV function is normal by echo in her exercise stress test was low risk. There is borderline hypertension however she is only on low-dose lisinopril. I would advise discontinuing that today switching her to propranolol X are 60 mg daily. This should give her a little blood pressure benefit as well as hopefully suppress some of her PVCs and PACs. There also may be a calming effect to help with some anxiety. She was recently given some Xanax to use and hopefully with the beta blocker she can reduce her dependency on Xanax. She says she's taken about 6 since it was prescribed.  Follow-up with me in one month.  Pixie Casino, MD, [facility] Attending Cardiologist [place] 10/02/2015, 12:48 PM

## 2015-10-04 ENCOUNTER — Other Ambulatory Visit: Payer: Self-pay | Admitting: Family Medicine

## 2015-10-04 DIAGNOSIS — M8580 Other specified disorders of bone density and structure, unspecified site: Secondary | ICD-10-CM

## 2015-10-10 ENCOUNTER — Ambulatory Visit (INDEPENDENT_AMBULATORY_CARE_PROVIDER_SITE_OTHER): Payer: Medicare HMO

## 2015-10-10 DIAGNOSIS — M8588 Other specified disorders of bone density and structure, other site: Secondary | ICD-10-CM | POA: Diagnosis not present

## 2015-10-10 DIAGNOSIS — M8580 Other specified disorders of bone density and structure, unspecified site: Secondary | ICD-10-CM

## 2015-10-29 ENCOUNTER — Encounter: Payer: Self-pay | Admitting: Internal Medicine

## 2015-10-29 ENCOUNTER — Ambulatory Visit (INDEPENDENT_AMBULATORY_CARE_PROVIDER_SITE_OTHER): Payer: Medicare HMO | Admitting: Internal Medicine

## 2015-10-29 VITALS — BP 108/78 | HR 83 | Ht 67.0 in | Wt 159.4 lb

## 2015-10-29 DIAGNOSIS — R002 Palpitations: Secondary | ICD-10-CM

## 2015-10-29 DIAGNOSIS — F419 Anxiety disorder, unspecified: Secondary | ICD-10-CM | POA: Diagnosis not present

## 2015-10-29 NOTE — Patient Instructions (Signed)
Medication Instructions:  Your physician recommends that you continue on your current medications as directed. Please refer to the Current Medication list given to you today.    Follow-Up: Your physician wants you to follow-up in: 6 MONTHS WITH DR HILTY. You will receive a reminder letter in the mail two months in advance. If you don't receive a letter, please call our office to schedule the follow-up appointment.   Any Other Special Instructions Will Be Listed Below (If Applicable).     If you need a refill on your cardiac medications before your next appointment, please call your pharmacy.   

## 2015-10-29 NOTE — Progress Notes (Signed)
OFFICE NOTE  Chief Complaint:  Follow-up studies  Primary Care Physician: Tula Nakayama  HPI:  Dawn Underwood is a 65 y.o. female who is currently referred to me for evaluation of palpitations. She reported that she's had palpitations on and off for years however recently had worsening symptoms after a bout of colitis with bright red blood per rectum. This ultimately resolved. She then had problems regulating her thyroid was referred to Dr. Dagmar Hait for further evaluation. She does have a history of graves disease. There is also underlying anxiety and of course palpitations in the past. Family history significant for some coronary disease in her grandparents and she has dyslipidemia and questionable hypertension (she's had both hypotension and hypertension at times, and may have white coat hypertension). In February she was taken the emergency department after an episode where she fell of flushing feeling over her body as well as her heart pounding out of her chest. She became very anxious about this and resented to the emergency department. She said she waited in the ER for several hours and her symptoms resolved prior to being completely evaluated. Ultimately she was told that this was a "panic attack". Since then she's had no other significant episodes but several other smaller episodes of palpitations. She also reports some left shoulder pain which is sharp and may be related to these palpitations. In addition there is some mild shortness of breath.  10/02/15  Dawn Underwood returns today for follow-up of her studies. She underwent an exercise treadmill stress test which was low risk for ischemia. She had an echocardiogram which showed EF 55-60% with normal systolic function, mild diastolic dysfunction trivial MR and mild TR. She did wear a monitor which demonstrated PVCs, bigeminal PVCs and PACs, likely the cause of her palpitations. She reports that they were particularly bothersome  this weekend however she was under significant stress and anxiety related to following around her grandchildren over the holiday.  10/29/2015  Dawn Underwood was seen back today in follow-up. She says her palpitations have nearly resolved on propranolol. She also thinks she's had improvement in her anxiety. She's not had take any additional Xanax since we last saw her month ago.  PMHx:  Past Medical History  Diagnosis Date  . Acute sinusitis, unspecified   . Other malaise and fatigue   . Lower back pain   . Light headedness   . Joint pain   . Hyperlipidemia   . Graves' disease   . Cystocele or rectocele with complete uterine prolapse   . Anemia     Past Surgical History  Procedure Laterality Date  . Cataract extraction      Bil  . Tubal ligation    . Abdominal hysterectomy    . Bladder mesh      FAMHx:  Family History  Problem Relation Age of Onset  . Diabetes Father     SOCHx:   reports that she has never smoked. She has never used smokeless tobacco. She reports that she drinks about 2.4 oz of alcohol per week. She reports that she does not use illicit drugs.  ALLERGIES:  Allergies  Allergen Reactions  . Erythromycin Other (See Comments)    Pt doesn't remember    ROS: Pertinent items noted in HPI and remainder of comprehensive ROS otherwise negative.  HOME MEDS: Current Outpatient Prescriptions  Medication Sig Dispense Refill  . ALPRAZolam (XANAX) 0.25 MG tablet Take 0.25 mg by mouth at bedtime as needed for anxiety.    Marland Kitchen  Ascorbic Acid (VITAMIN C) 1000 MG tablet Take 1,000 mg by mouth daily.    Marland Kitchen aspirin 81 MG tablet Take 81 mg by mouth every other day.     . calcium citrate-vitamin D (CITRACAL+D) 315-200 MG-UNIT tablet Take 1 tablet by mouth 2 (two) times daily. Take 1 tab by mouth in the morning and tabs in the evening    . Coconut Oil 1000 MG CAPS Take 1,000 mg by mouth daily.    . Coenzyme Q10 (CO Q 10) 10 MG CAPS Take 1 capsule by mouth 2 (two) times daily.     . Cyanocobalamin (B-12) 2500 MCG SUBL Place 1 each under the tongue daily.    Marland Kitchen docusate sodium (COLACE) 100 MG capsule Take 100 mg by mouth daily.    . ferrous sulfate 325 (65 FE) MG tablet Take 325 mg by mouth daily.    . fish oil-omega-3 fatty acids 1000 MG capsule Take 1 g by mouth 2 (two) times daily.     . Glucosamine-Chondroit-Vit C-Mn (GLUCOSAMINE 1500 COMPLEX PO) Take 1 tablet by mouth 2 (two) times daily.    . potassium chloride (K-DUR) 10 MEQ tablet Take 10 mEq by mouth daily.     . propranolol ER (INDERAL LA) 60 MG 24 hr capsule Take 1 capsule (60 mg total) by mouth daily. 30 capsule 5  . simvastatin (ZOCOR) 10 MG tablet Take 10 mg by mouth at bedtime.     No current facility-administered medications for this visit.    LABS/IMAGING: No results found for this or any previous visit (from the past 48 hour(s)). No results found.  WEIGHTS: Wt Readings from Last 3 Encounters:  10/29/15 159 lb 6.4 oz (72.303 kg)  10/02/15 158 lb 6.4 oz (71.85 kg)  09/27/15 158 lb 9.6 oz (71.94 kg)    VITALS: BP 108/78 mmHg  Pulse 83  Ht 5\' 7"  (1.702 m)  Wt 159 lb 6.4 oz (72.303 kg)  BMI 24.96 kg/m2  SpO2 98%  EXAM: Deferred  EKG: Deferred  ASSESSMENT: 1. Symptomatic palpitations - PAC's, PVC's and ventricular bigeminy 2. Mild dyspnea and atypical chest pain 3. 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 Possible hypertension  PLAN: 1.   Mrs. WitcherHas had significant improvement in her palpitations and anxiety. She has no need for Xanax at this time. She should continue this medication indefinitely.  Follow-up with me in 6 months.  Pixie Casino, MD, [facility] Attending Cardiologist [place] 10/29/2015, 2:31 PM

## 2015-12-18 ENCOUNTER — Other Ambulatory Visit: Payer: Self-pay | Admitting: Family Medicine

## 2015-12-18 DIAGNOSIS — N63 Unspecified lump in unspecified breast: Secondary | ICD-10-CM

## 2015-12-21 ENCOUNTER — Ambulatory Visit
Admission: RE | Admit: 2015-12-21 | Discharge: 2015-12-21 | Disposition: A | Discharge status: Home / Self Care | Payer: Medicare HMO | Source: Ambulatory Visit | Attending: Family Medicine | Admitting: Family Medicine

## 2015-12-21 DIAGNOSIS — N63 Unspecified lump in unspecified breast: Secondary | ICD-10-CM

## 2016-03-31 ENCOUNTER — Other Ambulatory Visit: Payer: Self-pay | Admitting: Internal Medicine

## 2016-03-31 NOTE — Telephone Encounter (Signed)
REFILL 

## 2016-04-17 ENCOUNTER — Other Ambulatory Visit: Payer: Self-pay | Admitting: Family Medicine

## 2016-04-17 DIAGNOSIS — Z1231 Encounter for screening mammogram for malignant neoplasm of breast: Secondary | ICD-10-CM

## 2016-05-21 ENCOUNTER — Ambulatory Visit: Payer: Medicare HMO

## 2016-05-30 ENCOUNTER — Encounter: Payer: Self-pay | Admitting: Internal Medicine

## 2016-05-30 ENCOUNTER — Ambulatory Visit (INDEPENDENT_AMBULATORY_CARE_PROVIDER_SITE_OTHER): Payer: PPO | Admitting: Internal Medicine

## 2016-05-30 VITALS — BP 120/82 | HR 65 | Ht 67.5 in | Wt 162.2 lb

## 2016-05-30 DIAGNOSIS — R002 Palpitations: Secondary | ICD-10-CM | POA: Diagnosis not present

## 2016-05-30 DIAGNOSIS — F419 Anxiety disorder, unspecified: Secondary | ICD-10-CM | POA: Diagnosis not present

## 2016-05-30 MED ORDER — PROPRANOLOL HCL ER 80 MG PO CP24: 80.0000 mg | ORAL_CAPSULE | Freq: Every day | ORAL | 3 refills | Status: DC

## 2016-05-30 MED ORDER — PROPRANOLOL HCL ER 80 MG PO CP24: 80.0000 mg | ORAL_CAPSULE | Freq: Every day | ORAL | 0 refills | Status: DC

## 2016-05-30 NOTE — Progress Notes (Signed)
OFFICE NOTE  Chief Complaint:  Palpitations  Primary Care Physician: Tula Nakayama  HPI:  Dawn Underwood is a 66 y.o. female who is currently referred to me for evaluation of palpitations. She reported that she's had palpitations on and off for years however recently had worsening symptoms after a bout of colitis with bright red blood per rectum. This ultimately resolved. She then had problems regulating her thyroid was referred to Dr. Dagmar Hait for further evaluation. She does have a history of graves disease. There is also underlying anxiety and of course palpitations in the past. Family history significant for some coronary disease in her grandparents and she has dyslipidemia and questionable hypertension (she's had both hypotension and hypertension at times, and may have white coat hypertension). In February she was taken the emergency department after an episode where she fell of flushing feeling over her body as well as her heart pounding out of her chest. She became very anxious about this and resented to the emergency department. She said she waited in the ER for several hours and her symptoms resolved prior to being completely evaluated. Ultimately she was told that this was a "panic attack". Since then she's had no other significant episodes but several other smaller episodes of palpitations. She also reports some left shoulder pain which is sharp and may be related to these palpitations. In addition there is some mild shortness of breath.  10/02/15  Dawn Underwood returns today for follow-up of her studies. She underwent an exercise treadmill stress test which was low risk for ischemia. She had an echocardiogram which showed EF 55-60% with normal systolic function, mild diastolic dysfunction trivial MR and mild TR. She did wear a monitor which demonstrated PVCs, bigeminal PVCs and PACs, likely the cause of her palpitations. She reports that they were particularly bothersome this  weekend however she was under significant stress and anxiety related to following around her grandchildren over the holiday.  10/29/2015  Dawn Underwood was seen back today in follow-up. She says her palpitations have nearly resolved on propranolol. She also thinks she's had improvement in her anxiety. She's not had take any additional Xanax since we last saw her month ago.  05/30/2016  Dawn Underwood returns today for follow-up. She reports a recently she's been having some more palpitations. In general are not nearly as bad as before she started propranolol but have become a little worse. She is struggling with some pain in her feet and has a heel spur. She says is difficult for her to walk and has been more anxious because of this. She is using Xanax but sparingly. In general that seems to help with her palpitations as well.  PMHx:  Past Medical History:  Diagnosis Date  . Acute sinusitis, unspecified   . Anemia   . Cystocele or rectocele with complete uterine prolapse   . Graves' disease   . Hyperlipidemia   . Joint pain   . Light headedness   . Lower back pain   . Other malaise and fatigue     Past Surgical History:  Procedure Laterality Date  . ABDOMINAL HYSTERECTOMY    . bladder mesh    . CATARACT EXTRACTION     Bil  . TUBAL LIGATION      FAMHx:  Family History  Problem Relation Age of Onset  . Diabetes Father     SOCHx:   reports that she has never smoked. She has never used smokeless tobacco. She reports that she  drinks about 2.4 oz of alcohol per week . She reports that she does not use drugs.  ALLERGIES:  Allergies  Allergen Reactions  . Erythromycin Other (See Comments)    Pt doesn't remember    ROS: Pertinent items noted in HPI and remainder of comprehensive ROS otherwise negative.  HOME MEDS: Current Outpatient Prescriptions  Medication Sig Dispense Refill  . ALPRAZolam (XANAX) 0.25 MG tablet Take 0.25 mg by mouth at bedtime as needed for anxiety.      . Ascorbic Acid (VITAMIN C) 1000 MG tablet Take 1,000 mg by mouth daily.    Marland Kitchen aspirin 81 MG tablet Take 81 mg by mouth every other day.     . calcium citrate-vitamin D (CITRACAL+D) 315-200 MG-UNIT tablet Take 1 tablet by mouth 2 (two) times daily. Take 1 tab by mouth in the morning and tabs in the evening    . Coconut Oil 1000 MG CAPS Take 1,000 mg by mouth daily.    . Cyanocobalamin (B-12) 2500 MCG SUBL Place 1 each under the tongue daily.    Marland Kitchen docusate sodium (COLACE) 100 MG capsule Take 100 mg by mouth daily.    . ferrous sulfate 325 (65 FE) MG tablet Take 325 mg by mouth daily.    . Glucosamine-Chondroit-Vit C-Mn (GLUCOSAMINE 1500 COMPLEX PO) Take 1 tablet by mouth 2 (two) times daily.    . potassium chloride (K-DUR) 10 MEQ tablet Take 10 mEq by mouth daily.     Marland Kitchen Resveratrol-Quercetin (RESVERATROL PLUS) 100-100 MG TABS Take 2 tablets by mouth daily.    . simvastatin (ZOCOR) 10 MG tablet Take 10 mg by mouth at bedtime.    . propranolol ER (INDERAL LA) 80 MG 24 hr capsule Take 1 capsule (80 mg total) by mouth daily. 30 capsule 0   No current facility-administered medications for this visit.     LABS/IMAGING: No results found for this or any previous visit (from the past 48 hour(s)). No results found.  WEIGHTS: Wt Readings from Last 3 Encounters:  05/30/16 162 lb 3.2 oz (73.6 kg)  10/29/15 159 lb 6.4 oz (72.3 kg)  10/02/15 158 lb 6.4 oz (71.8 kg)    VITALS: BP 120/82   Pulse 65   Ht 5' 7.5" (1.715 m)   Wt 162 lb 3.2 oz (73.6 kg)   LMP  (LMP Unknown)   BMI 25.03 kg/m   EXAM: General appearance: alert and no distress Lungs: clear to auscultation bilaterally Heart: regular rate and rhythm, S1, S2 normal, no murmur, click, rub or gallop Extremities: extremities normal, atraumatic, no cyanosis or edema Neurologic: Grossly normal  EKG: Sinus rhythm at 65  ASSESSMENT: 1. Symptomatic palpitations - PAC's, PVC's and ventricular bigeminy 2. Mild dyspnea and atypical chest  pain 3. 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 Possible hypertension  PLAN: 1.   Dawn Underwood has recently had some more palpitations. I suspect this could be due to stress and anxiety as well as possibly pain related to her feet problems. She is scheduled to see a podiatrist in a few days. For now, would recommend an increase in her propranolol to 80 mg daily to see if this provides any additional benefit.  Follow-up with me annually or sooner as necessary.  Pixie Casino, MD, [facility] Attending Cardiologist [place] 05/30/2016, 1:41 PM

## 2016-05-30 NOTE — Patient Instructions (Signed)
Your physician has recommended you make the following change in your medication:  1. INCREASE propranolol to 80mg  daily  -- a prescription has been sent to [facility] (mail order) & [facility]  Your physician wants you to follow-up in: ONE YEAR with Dr. Debara Pickett. You will receive a reminder letter in the mail two months in advance. If you don't receive a letter, please call our office to schedule the follow-up appointment.

## 2016-06-01 ENCOUNTER — Other Ambulatory Visit: Payer: Self-pay | Admitting: Internal Medicine

## 2016-06-03 ENCOUNTER — Ambulatory Visit (INDEPENDENT_AMBULATORY_CARE_PROVIDER_SITE_OTHER): Payer: PPO

## 2016-06-03 ENCOUNTER — Ambulatory Visit (INDEPENDENT_AMBULATORY_CARE_PROVIDER_SITE_OTHER): Payer: PPO | Admitting: Sports Medicine

## 2016-06-03 ENCOUNTER — Encounter: Payer: Self-pay | Admitting: Sports Medicine

## 2016-06-03 DIAGNOSIS — M79671 Pain in right foot: Secondary | ICD-10-CM

## 2016-06-03 DIAGNOSIS — M79672 Pain in left foot: Secondary | ICD-10-CM | POA: Diagnosis not present

## 2016-06-03 DIAGNOSIS — M722 Plantar fascial fibromatosis: Secondary | ICD-10-CM

## 2016-06-03 MED ORDER — METHYLPREDNISOLONE 4 MG PO TBPK: ORAL_TABLET | ORAL | 0 refills | Status: DC

## 2016-06-03 NOTE — Progress Notes (Signed)
Subjective: Dawn Underwood is a 66 y.o. female patient presents to office with complaint of heel pain on the right and arch pain on left x 4 months. Patient has treated this problem with stretching and icing; reports past history fasciitis. Pain is worse with first few steps and with walking and exercise. Denies any other pedal complaints.   Patient Active Problem List   Diagnosis Date Noted  . Anxiety 10/29/2015  . Palpitations 08/23/2015  . DOE (dyspnea on exertion) 08/23/2015  . Chest pain 08/23/2015  . BRBPR (bright red blood per rectum) 04/16/2015  . Colitis, acute 04/16/2015  . Hyperlipidemia 04/16/2015  . Colitis 04/16/2015    Current Outpatient Prescriptions on File Prior to Visit  Medication Sig Dispense Refill  . ALPRAZolam (XANAX) 0.25 MG tablet Take 0.25 mg by mouth at bedtime as needed for anxiety.    . Ascorbic Acid (VITAMIN C) 1000 MG tablet Take 1,000 mg by mouth daily.    Marland Kitchen aspirin 81 MG tablet Take 81 mg by mouth every other day.     . calcium citrate-vitamin D (CITRACAL+D) 315-200 MG-UNIT tablet Take 1 tablet by mouth 2 (two) times daily. Take 1 tab by mouth in the morning and tabs in the evening    . Coconut Oil 1000 MG CAPS Take 1,000 mg by mouth daily.    . Cyanocobalamin (B-12) 2500 MCG SUBL Place 1 each under the tongue daily.    Marland Kitchen docusate sodium (COLACE) 100 MG capsule Take 100 mg by mouth daily.    . ferrous sulfate 325 (65 FE) MG tablet Take 325 mg by mouth daily.    . Glucosamine-Chondroit-Vit C-Mn (GLUCOSAMINE 1500 COMPLEX PO) Take 1 tablet by mouth 2 (two) times daily.    . potassium chloride (K-DUR) 10 MEQ tablet Take 10 mEq by mouth daily.     . propranolol ER (INDERAL LA) 60 MG 24 hr capsule TAKE ONE CAPSULE BY MOUTH EVERY DAY. MUST HAVE OFFICE VISIT FOR FURTHER REFILLS 30 capsule 11  . propranolol ER (INDERAL LA) 80 MG 24 hr capsule Take 1 capsule (80 mg total) by mouth daily. 30 capsule 0  . Resveratrol-Quercetin (RESVERATROL PLUS) 100-100 MG TABS Take  2 tablets by mouth daily.    . simvastatin (ZOCOR) 10 MG tablet Take 10 mg by mouth at bedtime.     No current facility-administered medications on file prior to visit.     Allergies  Allergen Reactions  . Erythromycin Other (See Comments)    Pt doesn't remember    Objective: Physical Exam General: The patient is alert and oriented x3 in no acute distress.  Dermatology: Skin is warm, dry and supple bilateral lower extremities. Nails 1-10 are normal. There is no erythema, edema, no eccymosis, no open lesions present. Integument is otherwise unremarkable.  Vascular: Dorsalis Pedis pulse and Posterior Tibial pulse are 1/4 bilateral. Capillary fill time is immediate to all digits. + varicosities bilateral. No edema bilateral.   Neurological: Grossly intact to light touch with an achilles reflex of +2/5 and a negative Tinel's sign bilateral.  Musculoskeletal: Tenderness to palpation at the medial calcaneal tubercale and through the insertion of the plantar fascia on the left and right foot. No pain with compression of calcaneus bilateral. No pain with tuning fork to calcaneus bilateral. No pain with calf compression bilateral. There is decreased Ankle joint range of motion bilateral. All other joints range of motion within normal limits bilateral. Strength 5/5 in all groups bilateral. Pes planus and lesser hammertoe.  Xray, Right/Left foot:  Normal osseous mineralization. Joint spaces preserved. Pes planus. Lesser hammertoe. No fracture/dislocation/boney destruction. Calcaneal spur present with mild thickening of plantar fascia. No other soft tissue abnormalities or radiopaque foreign bodies.   Assessment and Plan: Problem List Items Addressed This Visit    None    Visit Diagnoses    Plantar fasciitis, bilateral    -  Primary   Relevant Orders   DG Foot 2 Views Left   DG Foot 2 Views Right   Plantar fasciitis       Relevant Medications   methylPREDNISolone (MEDROL DOSEPAK) 4 MG TBPK  tablet   Foot pain, bilateral          -Complete examination performed.  -Xrays reviewed -Discussed with patient in detail the condition of plantar fasciitis, how this occurs and general treatment options. Explained both conservative and surgical treatments.  -Patient declined injection  -Rx Medrol dose pack -Recommended good supportive shoes and advised use of OTC insert; Patient purchased powersteps -Explained and dispensed to patient daily stretching exercises. -Recommend patient to ice affected area 1-2x daily. -Patient to return to office in 4-6 weeks for follow up or sooner if problems or questions arise.  Landis Martins, DPM

## 2016-06-03 NOTE — Patient Instructions (Signed)
Plantar Fasciitis Rehab Ask your health care provider which exercises are safe for you. Do exercises exactly as told by your health care provider and adjust them as directed. It is normal to feel mild stretching, pulling, tightness, or discomfort as you do these exercises, but you should stop right away if you feel sudden pain or your pain gets worse. Do not begin these exercises until told by your health care provider. Stretching and range of motion exercises These exercises warm up your muscles and joints and improve the movement and flexibility of your foot. These exercises also help to relieve pain. Exercise A: Plantar fascia stretch 1. Sit with your left / right leg crossed over your opposite knee. 2. Hold your heel with one hand with that thumb near your arch. With your other hand, hold your toes and gently pull them back toward the top of your foot. You should feel a stretch on the bottom of your toes or your foot or both. 3. Hold this stretch for__________ seconds. 4. Slowly release your toes and return to the starting position. Repeat __________ times. Complete this exercise __________ times a day. Exercise B: Gastroc, standing 1. Stand with your hands against a wall. 2. Extend your left / right leg behind you, and bend your front knee slightly. 3. Keeping your heels on the floor and keeping your back knee straight, shift your weight toward the wall without arching your back. You should feel a gentle stretch in your left / right calf. 4. Hold this position for __________ seconds. Repeat __________ times. Complete this exercise __________ times a day. Exercise C: Soleus, standing 1. Stand with your hands against a wall. 2. Extend your left / right leg behind you, and bend your front knee slightly. 3. Keeping your heels on the floor, bend your back knee and slightly shift your weight over the back leg. You should feel a gentle stretch deep in your calf. 4. Hold this position for __________  seconds. Repeat __________ times. Complete this exercise __________ times a day. Exercise D: Gastrocsoleus, standing 1. Stand with the ball of your left / right foot on a step. The ball of your foot is on the walking surface, right under your toes. 2. Keep your other foot firmly on the same step. 3. Hold onto the wall or a railing for balance. 4. Slowly lift your other foot, allowing your body weight to press your heel down over the edge of the step. You should feel a stretch in your left / right calf. 5. Hold this position for __________ seconds. 6. Return both feet to the step. 7. Repeat this exercise with a slight bend in your left / right knee. Repeat __________ times with your left / right knee straight and __________ times with your left / right knee bent. Complete this exercise __________ times a day. Balance exercise This exercise builds your balance and strength control of your arch to help take pressure off your plantar fascia. Exercise E: Single leg stand 1. Without shoes, stand near a railing or in a doorway. You may hold onto the railing or door frame as needed. 2. Stand on your left / right foot. Keep your big toe down on the floor and try to keep your arch lifted. Do not let your foot roll inward. 3. Hold this position for __________ seconds. 4. If this exercise is too easy, you can try it with your eyes closed or while standing on a pillow. Repeat __________ times. Complete this exercise   __________ times a day. This information is not intended to replace advice given to you by your health care provider. Make sure you discuss any questions you have with your health care provider. Document Released: 04/21/2005 Document Revised: 12/25/2015 Document Reviewed: 03/05/2015 Elsevier Interactive Patient Education  2017 [employer].  For tennis shoes recommend:  Kandy Garrison Ascis New balance Saucony Can be purchased at [employer] sports or [employer]  Vionic  SAS Can be purchased  at [employer] or [employer]   For work shoes recommend: [employer] Work [employer]  Can be purchased at a variety of places or Engineer, maintenance (IT)   For casual shoes recommend: Vionic  Can be purchased at [employer] or [employer]

## 2016-06-11 ENCOUNTER — Ambulatory Visit
Admission: RE | Admit: 2016-06-11 | Discharge: 2016-06-11 | Disposition: A | Discharge status: Home / Self Care | Payer: PPO | Source: Ambulatory Visit | Attending: Family Medicine | Admitting: Family Medicine

## 2016-06-11 DIAGNOSIS — Z1231 Encounter for screening mammogram for malignant neoplasm of breast: Secondary | ICD-10-CM

## 2016-07-03 DIAGNOSIS — I83893 Varicose veins of bilateral lower extremities with other complications: Secondary | ICD-10-CM | POA: Diagnosis not present

## 2016-07-03 DIAGNOSIS — M79605 Pain in left leg: Secondary | ICD-10-CM | POA: Diagnosis not present

## 2016-07-03 DIAGNOSIS — M79604 Pain in right leg: Secondary | ICD-10-CM | POA: Diagnosis not present

## 2016-07-08 ENCOUNTER — Other Ambulatory Visit: Payer: Self-pay | Admitting: Vascular Surgery

## 2016-07-08 DIAGNOSIS — I83893 Varicose veins of bilateral lower extremities with other complications: Secondary | ICD-10-CM

## 2016-07-15 ENCOUNTER — Ambulatory Visit: Payer: PPO | Admitting: Sports Medicine

## 2016-07-29 ENCOUNTER — Ambulatory Visit (INDEPENDENT_AMBULATORY_CARE_PROVIDER_SITE_OTHER): Payer: PPO | Admitting: Sports Medicine

## 2016-07-29 DIAGNOSIS — M79672 Pain in left foot: Secondary | ICD-10-CM | POA: Diagnosis not present

## 2016-07-29 DIAGNOSIS — M79671 Pain in right foot: Secondary | ICD-10-CM

## 2016-07-29 DIAGNOSIS — M722 Plantar fascial fibromatosis: Secondary | ICD-10-CM | POA: Diagnosis not present

## 2016-07-29 MED ORDER — METHYLPREDNISOLONE 4 MG PO TBPK: ORAL_TABLET | ORAL | 0 refills | Status: DC

## 2016-07-29 NOTE — Progress Notes (Signed)
Subjective: Dawn Underwood is a 66 y.o. female returns to office for follow up evaluation bilateral plantar fasciitis; states that decreased in frequency to the area and is much better however the right foot hurts still when standing for a long time. Patient does not want an injection. Patient denies any recent changes in medications or new problems since last visit.   Patient Active Problem List   Diagnosis Date Noted  . Anxiety 10/29/2015  . Palpitations 08/23/2015  . DOE (dyspnea on exertion) 08/23/2015  . Chest pain 08/23/2015  . BRBPR (bright red blood per rectum) 04/16/2015  . Colitis, acute 04/16/2015  . Hyperlipidemia 04/16/2015  . Colitis 04/16/2015    Current Outpatient Prescriptions on File Prior to Visit  Medication Sig Dispense Refill  . ALPRAZolam (XANAX) 0.25 MG tablet Take 0.25 mg by mouth at bedtime as needed for anxiety.    . Ascorbic Acid (VITAMIN C) 1000 MG tablet Take 1,000 mg by mouth daily.    Marland Kitchen aspirin 81 MG tablet Take 81 mg by mouth every other day.     . calcium citrate-vitamin D (CITRACAL+D) 315-200 MG-UNIT tablet Take 1 tablet by mouth 2 (two) times daily. Take 1 tab by mouth in the morning and tabs in the evening    . Coconut Oil 1000 MG CAPS Take 1,000 mg by mouth daily.    . Cyanocobalamin (B-12) 2500 MCG SUBL Place 1 each under the tongue daily.    Marland Kitchen docusate sodium (COLACE) 100 MG capsule Take 100 mg by mouth daily.    . ferrous sulfate 325 (65 FE) MG tablet Take 325 mg by mouth daily.    . Glucosamine-Chondroit-Vit C-Mn (GLUCOSAMINE 1500 COMPLEX PO) Take 1 tablet by mouth 2 (two) times daily.    . methylPREDNISolone (MEDROL DOSEPAK) 4 MG TBPK tablet Take as instructed 21 tablet 0  . potassium chloride (K-DUR) 10 MEQ tablet Take 10 mEq by mouth daily.     . propranolol ER (INDERAL LA) 60 MG 24 hr capsule TAKE ONE CAPSULE BY MOUTH EVERY DAY. MUST HAVE OFFICE VISIT FOR FURTHER REFILLS 30 capsule 11  . propranolol ER (INDERAL LA) 80 MG 24 hr capsule Take 1  capsule (80 mg total) by mouth daily. 30 capsule 0  . Resveratrol-Quercetin (RESVERATROL PLUS) 100-100 MG TABS Take 2 tablets by mouth daily.    . simvastatin (ZOCOR) 10 MG tablet Take 10 mg by mouth at bedtime.     No current facility-administered medications on file prior to visit.     Allergies  Allergen Reactions  . Erythromycin Other (See Comments)    Pt doesn't remember    Objective:   General:  Alert and oriented x 3, in no acute distress  Dermatology: Skin is warm, dry, and supple bilateral. Nails are within normal limits. There is no lower extremity erythema, no eccymosis, no open lesions present bilateral.   Vascular: Dorsalis Pedis and Posterior Tibial pedal pulses are 1/4 bilateral. + hair growth noted bilateral. Capillary Fill Time is 3 seconds in all digits. + varicosities, No edema bilateral lower extremities.   Neurological: Sensation grossly intact to light touch with an achilles reflex of +2 and a negative Tinel's sign bilateral. Vibratory, sharp/dull, Semmes Weinstein Monofilament within normal limits.   Musculoskeletal: There is decreased tenderness to palpation at the medial calcaneal tubercale and through the insertion of the plantar fascia on the Left>right foot. No pain with compression to calcaneus or application of tuning fork. There is decreased Ankle joint range of motion  bilateral. All other jointsrange of motion  within normal limits bilateral. Strength 5/5 bilateral. Pes planus deformity and lesser hammertoes.   Assessment and Plan: Problem List Items Addressed This Visit    None    Visit Diagnoses    Plantar fasciitis, bilateral    -  Primary   Foot pain, bilateral          -Complete examination performed.  -Previous x-rays reviewed. -Discussed with patient in detail the condition of plantar fasciitis, how this  occurs related to the foot type of the patient and general treatment options. - Patient again declined injection. -Dispensed heel lifts   -Rx Medrol dosepak refill  -Continue with stretching, icing, good supportive shoes, inserts daily.  -Discussed long term care and reocurrence; will closely monitor; if fails to improve will consider other treatment modalities.  -Patient to return to office as needed or in 4-6 weeks for follow up or sooner if problems or questions arise.  Landis Martins, DPM

## 2016-07-30 ENCOUNTER — Telehealth: Payer: Self-pay | Admitting: *Deleted

## 2016-07-30 DIAGNOSIS — M722 Plantar fascial fibromatosis: Secondary | ICD-10-CM

## 2016-07-30 MED ORDER — METHYLPREDNISOLONE 4 MG PO TBPK: ORAL_TABLET | ORAL | 0 refills | Status: DC

## 2016-07-30 NOTE — Telephone Encounter (Signed)
Pt states the steroid was not called to the [facility] [address]. I reviewed LOV 07/29/2016 and Dr. Cannon Kettle had ordered the refill but the prompt for pharmacy was set for the wrong pharmacy. I changed to [facility] and reordered and informed pt of the change.

## 2016-08-26 ENCOUNTER — Encounter: Payer: Self-pay | Admitting: Vascular Surgery

## 2016-09-02 ENCOUNTER — Ambulatory Visit (HOSPITAL_COMMUNITY)
Admission: RE | Admit: 2016-09-02 | Discharge: 2016-09-02 | Disposition: A | Discharge status: Home / Self Care | Payer: PPO | Source: Ambulatory Visit | Attending: Vascular Surgery | Admitting: Vascular Surgery

## 2016-09-02 ENCOUNTER — Ambulatory Visit (INDEPENDENT_AMBULATORY_CARE_PROVIDER_SITE_OTHER): Payer: PPO | Admitting: Vascular Surgery

## 2016-09-02 ENCOUNTER — Encounter: Payer: Self-pay | Admitting: Vascular Surgery

## 2016-09-02 VITALS — BP 120/76 | HR 59 | Temp 97.5°F | Resp 16 | Ht 67.5 in | Wt 164.0 lb

## 2016-09-02 DIAGNOSIS — I83891 Varicose veins of right lower extremities with other complications: Secondary | ICD-10-CM | POA: Diagnosis not present

## 2016-09-02 DIAGNOSIS — I83893 Varicose veins of bilateral lower extremities with other complications: Secondary | ICD-10-CM

## 2016-09-02 NOTE — Progress Notes (Signed)
[facility]  Patient name: COPELAND LAPIER MRN: [id number] DOB: [birth year] Sex: female  REASON FOR CONSULT: Evaluation of painful varicosities right leg  HPI: TED LEONHART is a 66 y.o. female, who is her today for discussion of painful varicosities. She is a very pleasant 66 year old here with her husband today. 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 This is been slowly progressive over many years. She is not having any history of DVT or bleeding from her varicosities. 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 Does have a sensation of heaviness at the end of the long day in both lower extremity is right worse than left. Does have mild to moderate swelling. She has been wearing graduated compression stockings for several months and reports that these are not giving her any improvement.  Past Medical History:  Diagnosis Date  . Acute sinusitis, unspecified   . Anemia   . Cystocele or rectocele with complete uterine prolapse   . Graves' disease   . Hyperlipidemia   . Joint pain   . Light headedness   . Lower back pain   . Other malaise and fatigue     Family History  Problem Relation Age of Onset  . Diabetes Father     SOCIAL HISTORY: Social History   Social History  . Marital status: Married    Spouse name: N/A  . Number of children: N/A  . Years of education: N/A   Occupational History  . Not on file.   Social History Main Topics  . Smoking status: Never Smoker  . Smokeless tobacco: Never Used  . Alcohol use 2.4 oz/week    4 Glasses of wine per week     Comment: socially on weekends  . Drug use: No  . Sexual activity: Not on file   Other Topics Concern  . Not on file   Social History Narrative  . No narrative on file    Allergies  Allergen Reactions  . Erythromycin Other (See Comments)    Pt doesn't remember      Current Outpatient Prescriptions  Medication Sig Dispense Refill  . ALPRAZolam (XANAX) 0.25 MG tablet Take 0.25 mg by mouth at bedtime as needed for anxiety.    . Ascorbic Acid (VITAMIN C) 1000 MG tablet Take 1,000 mg by mouth daily.    . calcium citrate-vitamin D (CITRACAL+D) 315-200 MG-UNIT tablet Take 1 tablet by mouth 2 (two) times daily. Take 1 tab by mouth in the morning and tabs in the evening    . Cyanocobalamin (B-12) 2500 MCG SUBL Place 1 each under the tongue daily.    Marland Kitchen docusate sodium (COLACE) 100 MG capsule Take 100 mg by mouth daily.    . ferrous sulfate 325 (65 FE) MG tablet Take 325 mg by mouth daily.    . Glucosamine-Chondroit-Vit C-Mn (GLUCOSAMINE 1500 COMPLEX PO) Take 1 tablet by mouth 2 (two) times daily.    . potassium chloride (K-DUR) 10 MEQ tablet Take 10 mEq by mouth daily.     . propranolol ER (INDERAL LA) 80 MG 24 hr capsule Take 1 capsule (80 mg total) by mouth daily. 30 capsule 0  . simvastatin (ZOCOR) 10 MG tablet Take 10 mg by mouth at bedtime.     No current facility-administered medications for this visit.     REVIEW OF SYSTEMS:  [X]  denotes positive finding, [ ]  denotes negative finding Cardiac  Comments:  Chest pain or chest pressure:    Shortness of breath upon exertion:    Short of breath when lying flat:    Irregular heart rhythm:        Vascular    Pain in calf, thigh, or hip brought on by ambulation:    Pain in feet at night that wakes you up from your sleep:  x   Blood clot in your veins:    Leg swelling:         Pulmonary    Oxygen at home:    Productive cough:     Wheezing:         Neurologic    Sudden weakness in arms or legs:     Sudden numbness in arms or legs:     Sudden onset of difficulty speaking or slurred speech:    Temporary loss of vision in one eye:     Problems with dizziness:         Gastrointestinal    Blood in stool:     Vomited blood:         Genitourinary    Burning when urinating:     Blood in urine:         Psychiatric    Major depression:         Hematologic    Bleeding problems:    Problems with blood clotting too easily:        Skin    Rashes or ulcers:        Constitutional    Fever or chills:      PHYSICAL EXAM: Vitals:   09/02/16 1251  BP: 120/76  Pulse: (!) 59  Resp: 16  Temp: 97.5 F (36.4 C)  TempSrc: Oral  SpO2: 98%  Weight: 164 lb (74.4 kg)  Height: 5' 7.5" (1.715 m)    GENERAL: The patient is a well-nourished female, in no acute distress. The vital signs are documented above. CARDIOVASCULAR: 2+ dorsalis pedis pulses bilaterally. Extensive varicosities over the posterior mid to distal right thigh down to the popliteal space. Extensive telangiectasia covering both lower extremities down onto the foot and ankle PULMONARY: There is good air exchange  ABDOMEN: Soft and non-tender  MUSCULOSKELETAL: There are no major deformities or cyanosis. NEUROLOGIC: No focal weakness or paresthesias are detected. SKIN: There are no ulcers or rashes noted. PSYCHIATRIC: The patient has a normal affect.  DATA:  Lower from the duplex shows reflux in her deep system bilaterally. Does have some reflux in the proximal saphenous vein at the saphenofemoral junction with no reflux below this.  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 No communication identified to the saphenous or deep system. Do feel that these would be appropriate for foam sclerotherapy treatment. Explained that this could also be treated with stab phlebectomy which would be more involved. I would recommend initial attempts at the foam sclerotherapy and persistent she has good results. She is also questioning the use of this for the multiple telangiectasia. Explained this certainly would be possible but since she has extensive land station both the legs would be a lengthy process. She understands and wished to proceed once we have assured insurance coverage   Rosetta Posner, MD  [facility] Tel [phone] Pager [phone]

## 2016-09-09 ENCOUNTER — Ambulatory Visit: Payer: PPO | Admitting: Sports Medicine

## 2016-09-19 ENCOUNTER — Encounter: Payer: Self-pay | Admitting: Vascular Surgery

## 2016-09-23 ENCOUNTER — Encounter: Payer: Self-pay | Admitting: Podiatry

## 2016-09-23 ENCOUNTER — Ambulatory Visit (INDEPENDENT_AMBULATORY_CARE_PROVIDER_SITE_OTHER): Payer: PPO | Admitting: Podiatry

## 2016-09-23 DIAGNOSIS — M722 Plantar fascial fibromatosis: Secondary | ICD-10-CM

## 2016-09-23 NOTE — Patient Instructions (Signed)

## 2016-09-24 NOTE — Progress Notes (Signed)
Subjective: Elmer Ramp Penza presents to the office today for follow-up evaluation of bilateral heel pain. She states that the left does not hurt but she is still getting pain to the right foot. She has tried oral steroids, stretching, icing and an OTC insert. She does not like the insert as they don't fit in her shoes. Denies any numbness or tingling and the pain does not wake her up at night.  They have been icing, stretching, try to wear supportive shoe as much as possible. No other complaints at this time. No acute changes since last appointment. They deny any systemic complaints such as fevers, chills, nausea, vomiting.  Objective: General: AAO x3, NAD  Dermatological: Skin is warm, dry and supple bilateral. Nails x 10 are well manicured; remaining integument appears unremarkable at this time. There are no open sores, no preulcerative lesions, no rash or signs of infection present.  Vascular: Dorsalis Pedis artery and Posterior Tibial artery pedal pulses are 2/4 bilateral with immedate capillary fill time. Pedal hair growth present. There is no pain with calf compression, swelling, warmth, erythema.   Neruologic: Grossly intact via light touch bilateral. Vibratory intact via tuning fork bilateral. Protective threshold with Semmes Wienstein monofilament intact to all pedal sites bilateral. Tinel sign negative.   Musculoskeletal: There is continued tenderness palpation along the plantar medial tubercle of the calcaneus at the insertion of the plantar fascia on the right foot. No pain on the left. There is no pain along the course of the plantar fascia within the arch of the foot. Plantar fascia appears to be intact bilaterally. There is no pain with lateral compression of the calcaneus and there is no pain with vibratory sensation. There is no pain along the course or insertion of the Achilles tendon. There are no other areas of tenderness to bilateral lower extremities. No gross boney pedal deformities  bilateral. No pain, crepitus, or limitation noted with foot and ankle range of motion bilateral. Muscular strength 5/5 in all groups tested bilateral. Equinus is present.   Gait: Unassisted, Nonantalgic.   Assessment: Presents for follow-up evaluation for heel pain, likely plantar fasciitis   Plan: -Treatment options discussed including all alternatives, risks, and complications -Declines a steroid injection. At this point I have recommended physical therapy. Prescription for [facility] was provided to the patient.  -Recommeded a night splint to use at home as well given the continued pain and equinus. Will submit for prior authorization.  -Ice and stretching exercises on a daily basis. -Continue supportive shoe gear. -Follow-up after PT or sooner if any problems arise. In the meantime, encouraged to call the office with any questions, concerns, change in symptoms.   Celesta Gentile, DPM

## 2016-09-25 ENCOUNTER — Telehealth: Payer: Self-pay | Admitting: *Deleted

## 2016-09-25 NOTE — Telephone Encounter (Signed)
Sent a fax to [employer] for the patient to get prior approval for a night splint. Dawn Underwood

## 2016-09-25 NOTE — Telephone Encounter (Signed)
Error

## 2016-10-01 DIAGNOSIS — M25671 Stiffness of right ankle, not elsewhere classified: Secondary | ICD-10-CM | POA: Diagnosis not present

## 2016-10-01 DIAGNOSIS — M25674 Stiffness of right foot, not elsewhere classified: Secondary | ICD-10-CM | POA: Diagnosis not present

## 2016-10-01 DIAGNOSIS — M25571 Pain in right ankle and joints of right foot: Secondary | ICD-10-CM | POA: Diagnosis not present

## 2016-10-01 DIAGNOSIS — M722 Plantar fascial fibromatosis: Secondary | ICD-10-CM | POA: Diagnosis not present

## 2016-10-02 ENCOUNTER — Ambulatory Visit (INDEPENDENT_AMBULATORY_CARE_PROVIDER_SITE_OTHER): Payer: PPO | Admitting: Vascular Surgery

## 2016-10-02 ENCOUNTER — Encounter: Payer: Self-pay | Admitting: Vascular Surgery

## 2016-10-02 VITALS — BP 130/76 | HR 65 | Temp 98.0°F | Resp 16 | Ht 67.5 in | Wt 161.0 lb

## 2016-10-02 DIAGNOSIS — I83891 Varicose veins of right lower extremities with other complications: Secondary | ICD-10-CM | POA: Diagnosis not present

## 2016-10-02 NOTE — Progress Notes (Signed)
    Stab Phlebectomy Procedure  Elmer Ramp Difatta DOB:Jul 25, 1950  10/02/2016  Consent signed: Yes  Surgeon:T.F. Early, MD  Procedure: stab phlebectomy: right leg  BP 130/76   Pulse 65   Temp 98 F (36.7 C)   Resp 16   Ht 5' 7.5" (1.715 m)   Wt 161 lb (73 kg)   LMP  (LMP Unknown)   SpO2 99%   BMI 24.84 kg/m   Start time: 10:30   End time: 11:15   Tumescent Anesthesia: 250 cc 0.9% NaCl with 50 cc Lidocaine HCL with 1% Epi and 15 cc 8.4% NaHCO3  Local Anesthesia: 4 cc Lidocaine HCL and NaHCO3 (ratio 2:1)    Stab Phlebectomy: 10-20 Sites: Calf  Patient tolerated procedure well: Yes  Notes: nervous  Description of Procedure:  After marking the course of the secondary varicosities, the patient was placed on the operating table in the prone and supine position, and the right leg was prepped and draped in sterile fashion.    The patient was then put into Trendelenburg position.  Local anesthetic was administered at the previously marked varicosities, and tumescent anesthesia was administered around the vessels.  Ten to 20 stab wounds were made using the tip of an 11 blade. And using the vein hook, the phlebectomies were performed using a hemostat to avulse the varicosities.  Adequate hemostasis was achieved, and steri strips were applied to the stab wound.      ABD pads and thigh high compression stockings were applied as well ace wraps where needed. Blood loss was less than 15 cc.  The patient ambulated out of the operating room having tolerated the procedure well.

## 2016-10-07 ENCOUNTER — Encounter: Payer: Self-pay | Admitting: Vascular Surgery

## 2016-10-09 ENCOUNTER — Other Ambulatory Visit: Payer: PPO | Admitting: Vascular Surgery

## 2016-10-09 DIAGNOSIS — I10 Essential (primary) hypertension: Secondary | ICD-10-CM | POA: Diagnosis not present

## 2016-10-09 DIAGNOSIS — D508 Other iron deficiency anemias: Secondary | ICD-10-CM | POA: Diagnosis not present

## 2016-10-09 DIAGNOSIS — E7800 Pure hypercholesterolemia, unspecified: Secondary | ICD-10-CM | POA: Diagnosis not present

## 2016-10-09 DIAGNOSIS — Z23 Encounter for immunization: Secondary | ICD-10-CM | POA: Diagnosis not present

## 2016-10-14 DIAGNOSIS — M722 Plantar fascial fibromatosis: Secondary | ICD-10-CM | POA: Diagnosis not present

## 2016-10-14 DIAGNOSIS — M25674 Stiffness of right foot, not elsewhere classified: Secondary | ICD-10-CM | POA: Diagnosis not present

## 2016-10-14 DIAGNOSIS — M25671 Stiffness of right ankle, not elsewhere classified: Secondary | ICD-10-CM | POA: Diagnosis not present

## 2016-10-14 DIAGNOSIS — M25571 Pain in right ankle and joints of right foot: Secondary | ICD-10-CM | POA: Diagnosis not present

## 2016-10-16 DIAGNOSIS — M25571 Pain in right ankle and joints of right foot: Secondary | ICD-10-CM | POA: Diagnosis not present

## 2016-10-16 DIAGNOSIS — M25674 Stiffness of right foot, not elsewhere classified: Secondary | ICD-10-CM | POA: Diagnosis not present

## 2016-10-16 DIAGNOSIS — M25671 Stiffness of right ankle, not elsewhere classified: Secondary | ICD-10-CM | POA: Diagnosis not present

## 2016-10-16 DIAGNOSIS — M722 Plantar fascial fibromatosis: Secondary | ICD-10-CM | POA: Diagnosis not present

## 2016-10-28 DIAGNOSIS — M25674 Stiffness of right foot, not elsewhere classified: Secondary | ICD-10-CM | POA: Diagnosis not present

## 2016-10-28 DIAGNOSIS — M25671 Stiffness of right ankle, not elsewhere classified: Secondary | ICD-10-CM | POA: Diagnosis not present

## 2016-10-28 DIAGNOSIS — M25571 Pain in right ankle and joints of right foot: Secondary | ICD-10-CM | POA: Diagnosis not present

## 2016-10-28 DIAGNOSIS — M722 Plantar fascial fibromatosis: Secondary | ICD-10-CM | POA: Diagnosis not present

## 2016-10-30 DIAGNOSIS — M722 Plantar fascial fibromatosis: Secondary | ICD-10-CM | POA: Diagnosis not present

## 2016-10-30 DIAGNOSIS — M25671 Stiffness of right ankle, not elsewhere classified: Secondary | ICD-10-CM | POA: Diagnosis not present

## 2016-10-30 DIAGNOSIS — M25674 Stiffness of right foot, not elsewhere classified: Secondary | ICD-10-CM | POA: Diagnosis not present

## 2016-10-30 DIAGNOSIS — M25571 Pain in right ankle and joints of right foot: Secondary | ICD-10-CM | POA: Diagnosis not present

## 2016-11-03 ENCOUNTER — Encounter: Payer: Self-pay | Admitting: *Deleted

## 2016-11-03 DIAGNOSIS — M25571 Pain in right ankle and joints of right foot: Secondary | ICD-10-CM | POA: Diagnosis not present

## 2016-11-03 DIAGNOSIS — M25674 Stiffness of right foot, not elsewhere classified: Secondary | ICD-10-CM | POA: Diagnosis not present

## 2016-11-03 DIAGNOSIS — M25671 Stiffness of right ankle, not elsewhere classified: Secondary | ICD-10-CM | POA: Diagnosis not present

## 2016-11-03 DIAGNOSIS — R262 Difficulty in walking, not elsewhere classified: Secondary | ICD-10-CM | POA: Diagnosis not present

## 2016-11-03 DIAGNOSIS — I10 Essential (primary) hypertension: Secondary | ICD-10-CM | POA: Diagnosis not present

## 2016-11-03 DIAGNOSIS — M6281 Muscle weakness (generalized): Secondary | ICD-10-CM | POA: Diagnosis not present

## 2016-11-03 DIAGNOSIS — M722 Plantar fascial fibromatosis: Secondary | ICD-10-CM | POA: Diagnosis not present

## 2016-11-04 DIAGNOSIS — M722 Plantar fascial fibromatosis: Secondary | ICD-10-CM | POA: Diagnosis not present

## 2016-11-04 DIAGNOSIS — I10 Essential (primary) hypertension: Secondary | ICD-10-CM | POA: Diagnosis not present

## 2016-11-04 DIAGNOSIS — M25571 Pain in right ankle and joints of right foot: Secondary | ICD-10-CM | POA: Diagnosis not present

## 2016-11-04 DIAGNOSIS — M6281 Muscle weakness (generalized): Secondary | ICD-10-CM | POA: Diagnosis not present

## 2016-11-04 DIAGNOSIS — M25674 Stiffness of right foot, not elsewhere classified: Secondary | ICD-10-CM | POA: Diagnosis not present

## 2016-11-04 DIAGNOSIS — M25671 Stiffness of right ankle, not elsewhere classified: Secondary | ICD-10-CM | POA: Diagnosis not present

## 2016-11-04 DIAGNOSIS — R262 Difficulty in walking, not elsewhere classified: Secondary | ICD-10-CM | POA: Diagnosis not present

## 2016-11-10 DIAGNOSIS — M25674 Stiffness of right foot, not elsewhere classified: Secondary | ICD-10-CM | POA: Diagnosis not present

## 2016-11-10 DIAGNOSIS — M6281 Muscle weakness (generalized): Secondary | ICD-10-CM | POA: Diagnosis not present

## 2016-11-10 DIAGNOSIS — I10 Essential (primary) hypertension: Secondary | ICD-10-CM | POA: Diagnosis not present

## 2016-11-10 DIAGNOSIS — R262 Difficulty in walking, not elsewhere classified: Secondary | ICD-10-CM | POA: Diagnosis not present

## 2016-11-10 DIAGNOSIS — M25671 Stiffness of right ankle, not elsewhere classified: Secondary | ICD-10-CM | POA: Diagnosis not present

## 2016-11-10 DIAGNOSIS — M25571 Pain in right ankle and joints of right foot: Secondary | ICD-10-CM | POA: Diagnosis not present

## 2016-11-10 DIAGNOSIS — M722 Plantar fascial fibromatosis: Secondary | ICD-10-CM | POA: Diagnosis not present

## 2016-11-11 DIAGNOSIS — I10 Essential (primary) hypertension: Secondary | ICD-10-CM | POA: Diagnosis not present

## 2016-11-11 DIAGNOSIS — M722 Plantar fascial fibromatosis: Secondary | ICD-10-CM | POA: Diagnosis not present

## 2016-11-11 DIAGNOSIS — M6281 Muscle weakness (generalized): Secondary | ICD-10-CM | POA: Diagnosis not present

## 2016-11-11 DIAGNOSIS — R262 Difficulty in walking, not elsewhere classified: Secondary | ICD-10-CM | POA: Diagnosis not present

## 2016-11-11 DIAGNOSIS — M25671 Stiffness of right ankle, not elsewhere classified: Secondary | ICD-10-CM | POA: Diagnosis not present

## 2016-11-11 DIAGNOSIS — M25571 Pain in right ankle and joints of right foot: Secondary | ICD-10-CM | POA: Diagnosis not present

## 2016-11-11 DIAGNOSIS — M25674 Stiffness of right foot, not elsewhere classified: Secondary | ICD-10-CM | POA: Diagnosis not present

## 2016-11-12 ENCOUNTER — Ambulatory Visit (INDEPENDENT_AMBULATORY_CARE_PROVIDER_SITE_OTHER): Payer: PPO | Admitting: *Deleted

## 2016-11-12 DIAGNOSIS — I83891 Varicose veins of right lower extremities with other complications: Secondary | ICD-10-CM | POA: Diagnosis not present

## 2016-11-12 NOTE — Progress Notes (Signed)
X=.3% Sotradecol administered with a 27g butterfly.  Patient received a total of 24cc.  Pt. Has many spiders veins. This was her one ins covered tx. Hit as many as possible. Easy access. She c/o sticks but not the itching or burning. Will follow her prn if she needs further claen up.   Compression stockings applied: Yes.

## 2016-11-17 ENCOUNTER — Encounter: Payer: Self-pay | Admitting: *Deleted

## 2016-11-27 DIAGNOSIS — M6281 Muscle weakness (generalized): Secondary | ICD-10-CM | POA: Diagnosis not present

## 2016-11-27 DIAGNOSIS — M25571 Pain in right ankle and joints of right foot: Secondary | ICD-10-CM | POA: Diagnosis not present

## 2016-11-27 DIAGNOSIS — M25674 Stiffness of right foot, not elsewhere classified: Secondary | ICD-10-CM | POA: Diagnosis not present

## 2016-11-27 DIAGNOSIS — I10 Essential (primary) hypertension: Secondary | ICD-10-CM | POA: Diagnosis not present

## 2016-11-27 DIAGNOSIS — M722 Plantar fascial fibromatosis: Secondary | ICD-10-CM | POA: Diagnosis not present

## 2016-11-27 DIAGNOSIS — R262 Difficulty in walking, not elsewhere classified: Secondary | ICD-10-CM | POA: Diagnosis not present

## 2016-11-27 DIAGNOSIS — M25671 Stiffness of right ankle, not elsewhere classified: Secondary | ICD-10-CM | POA: Diagnosis not present

## 2016-12-01 DIAGNOSIS — R262 Difficulty in walking, not elsewhere classified: Secondary | ICD-10-CM | POA: Diagnosis not present

## 2016-12-01 DIAGNOSIS — M25671 Stiffness of right ankle, not elsewhere classified: Secondary | ICD-10-CM | POA: Diagnosis not present

## 2016-12-01 DIAGNOSIS — M25674 Stiffness of right foot, not elsewhere classified: Secondary | ICD-10-CM | POA: Diagnosis not present

## 2016-12-01 DIAGNOSIS — I10 Essential (primary) hypertension: Secondary | ICD-10-CM | POA: Diagnosis not present

## 2016-12-01 DIAGNOSIS — M25571 Pain in right ankle and joints of right foot: Secondary | ICD-10-CM | POA: Diagnosis not present

## 2016-12-01 DIAGNOSIS — M6281 Muscle weakness (generalized): Secondary | ICD-10-CM | POA: Diagnosis not present

## 2016-12-01 DIAGNOSIS — M722 Plantar fascial fibromatosis: Secondary | ICD-10-CM | POA: Diagnosis not present

## 2017-01-23 DIAGNOSIS — Z23 Encounter for immunization: Secondary | ICD-10-CM | POA: Diagnosis not present

## 2017-03-17 DIAGNOSIS — H52223 Regular astigmatism, bilateral: Secondary | ICD-10-CM | POA: Diagnosis not present

## 2017-03-17 DIAGNOSIS — H5203 Hypermetropia, bilateral: Secondary | ICD-10-CM | POA: Diagnosis not present

## 2017-03-17 DIAGNOSIS — H524 Presbyopia: Secondary | ICD-10-CM | POA: Diagnosis not present

## 2017-03-17 DIAGNOSIS — H1045 Other chronic allergic conjunctivitis: Secondary | ICD-10-CM | POA: Diagnosis not present

## 2017-04-01 DIAGNOSIS — R1032 Left lower quadrant pain: Secondary | ICD-10-CM | POA: Diagnosis not present

## 2017-04-01 DIAGNOSIS — R61 Generalized hyperhidrosis: Secondary | ICD-10-CM | POA: Diagnosis not present

## 2017-04-24 DIAGNOSIS — G8929 Other chronic pain: Secondary | ICD-10-CM | POA: Diagnosis not present

## 2017-04-24 DIAGNOSIS — M545 Low back pain: Secondary | ICD-10-CM | POA: Diagnosis not present

## 2017-04-24 DIAGNOSIS — M25552 Pain in left hip: Secondary | ICD-10-CM | POA: Diagnosis not present

## 2017-05-02 DIAGNOSIS — M25552 Pain in left hip: Secondary | ICD-10-CM | POA: Diagnosis not present

## 2017-05-04 DIAGNOSIS — E0500 Thyrotoxicosis with diffuse goiter without thyrotoxic crisis or storm: Secondary | ICD-10-CM | POA: Insufficient documentation

## 2017-05-04 DIAGNOSIS — J019 Acute sinusitis, unspecified: Secondary | ICD-10-CM | POA: Insufficient documentation

## 2017-05-04 DIAGNOSIS — D649 Anemia, unspecified: Secondary | ICD-10-CM | POA: Insufficient documentation

## 2017-05-04 DIAGNOSIS — R42 Dizziness and giddiness: Secondary | ICD-10-CM | POA: Insufficient documentation

## 2017-05-04 DIAGNOSIS — M545 Low back pain, unspecified: Secondary | ICD-10-CM | POA: Insufficient documentation

## 2017-05-04 DIAGNOSIS — M2550 Pain in unspecified joint: Secondary | ICD-10-CM | POA: Insufficient documentation

## 2017-05-11 DIAGNOSIS — M25552 Pain in left hip: Secondary | ICD-10-CM | POA: Diagnosis not present

## 2017-05-18 ENCOUNTER — Other Ambulatory Visit: Payer: Self-pay | Admitting: Family Medicine

## 2017-05-18 DIAGNOSIS — Z1231 Encounter for screening mammogram for malignant neoplasm of breast: Secondary | ICD-10-CM

## 2017-06-01 ENCOUNTER — Encounter: Payer: Self-pay | Admitting: Internal Medicine

## 2017-06-01 ENCOUNTER — Ambulatory Visit: Payer: PPO | Admitting: Internal Medicine

## 2017-06-01 VITALS — BP 124/78 | HR 67 | Ht 67.5 in | Wt 167.4 lb

## 2017-06-01 DIAGNOSIS — R002 Palpitations: Secondary | ICD-10-CM

## 2017-06-01 DIAGNOSIS — K219 Gastro-esophageal reflux disease without esophagitis: Secondary | ICD-10-CM | POA: Diagnosis not present

## 2017-06-01 DIAGNOSIS — F419 Anxiety disorder, unspecified: Secondary | ICD-10-CM | POA: Diagnosis not present

## 2017-06-01 MED ORDER — PROPRANOLOL HCL ER 80 MG PO CP24: 80.0000 mg | ORAL_CAPSULE | Freq: Every day | ORAL | 3 refills | Status: DC

## 2017-06-01 NOTE — Progress Notes (Signed)
OFFICE NOTE  Chief Complaint:  Vomiting, hot/flushed feeling  Primary Care Physician: Aletha Halim., PA-C  HPI:  Dawn Underwood is a 67 y.o. female who is currently referred to me for evaluation of palpitations. She reported that she's had palpitations on and off for years however recently had worsening symptoms after a bout of colitis with bright red blood per rectum. This ultimately resolved. She then had problems regulating her thyroid was referred to Dr. Dagmar Hait for further evaluation. She does have a history of graves disease. There is also underlying anxiety and of course palpitations in the past. Family history significant for some coronary disease in her grandparents and she has dyslipidemia and questionable hypertension (she's had both hypotension and hypertension at times, and may have white coat hypertension). In February she was taken the emergency department after an episode where she fell of flushing feeling over her body as well as her heart pounding out of her chest. She became very anxious about this and resented to the emergency department. She said she waited in the ER for several hours and her symptoms resolved prior to being completely evaluated. Ultimately she was told that this was a "panic attack". Since then she's had no other significant episodes but several other smaller episodes of palpitations. She also reports some left shoulder pain which is sharp and may be related to these palpitations. In addition there is some mild shortness of breath.  10/02/15  Dawn Underwood returns today for follow-up of her studies. She underwent an exercise treadmill stress test which was low risk for ischemia. She had an echocardiogram which showed EF 55-60% with normal systolic function, mild diastolic dysfunction trivial MR and mild TR. She did wear a monitor which demonstrated PVCs, bigeminal PVCs and PACs, likely the cause of her palpitations. She reports that they were  particularly bothersome this weekend however she was under significant stress and anxiety related to following around her grandchildren over the holiday.  10/29/2015  Dawn Underwood was seen back today in follow-up. She says her palpitations have nearly resolved on propranolol. She also thinks she's had improvement in her anxiety. She's not had take any additional Xanax since we last saw her month ago.  05/30/2016  Dawn Underwood returns today for follow-up. She reports a recently she's been having some more palpitations. In general are not nearly as bad as before she started propranolol but have become a little worse. She is struggling with some pain in her feet and has a heel spur. She says is difficult for her to walk and has been more anxious because of this. She is using Xanax but sparingly. In general that seems to help with her palpitations as well.  06/01/2017  Dawn Underwood was seen today in annual follow-up.  She reports her palpitations have been very well controlled.  Blood pressure is at goal.  She exercises 3-5 days a week without any limitations.  She reports 2 episodes over the past year of feeling of hot and flushed with some associated vomiting or diarrhea.  She is also had some morning vomiting after taking her medications and drinking a cup of coffee.  She typically does not have any food on her stomach that time.  It was thought that these might be hot flashes however she does not feel like there typically associated with that.  To me these episodes sound like reflux.  We talked about ways to try to reduce reflux and that she could consider over-the-counter  H2 blockers or PPIs.  PMHx:  Past Medical History:  Diagnosis Date  . Acute sinusitis, unspecified   . Anemia   . Cystocele or rectocele with complete uterine prolapse   . Graves' disease   . Hyperlipidemia   . Joint pain   . Light headedness   . Lower back pain   . Other malaise and fatigue     Past Surgical History:    Procedure Laterality Date  . ABDOMINAL HYSTERECTOMY    . bladder mesh    . CATARACT EXTRACTION     Bil  . TUBAL LIGATION      FAMHx:  Family History  Problem Relation Age of Onset  . Diabetes Father     SOCHx:   reports that  has never smoked. she has never used smokeless tobacco. She reports that she drinks about 2.4 oz of alcohol per week. She reports that she does not use drugs.  ALLERGIES:  Allergies  Allergen Reactions  . Erythromycin Other (See Comments)    Pt doesn't remember    ROS: Pertinent items noted in HPI and remainder of comprehensive ROS otherwise negative.  HOME MEDS: Current Outpatient Medications  Medication Sig Dispense Refill  . Ascorbic Acid (VITAMIN C) 1000 MG tablet Take 1,000 mg by mouth daily.    . calcium citrate-vitamin D (CITRACAL+D) 315-200 MG-UNIT tablet Take 1 tablet by mouth 2 (two) times daily. Take 1 tab by mouth in the morning and tabs in the evening    . Cyanocobalamin (B-12) 2500 MCG SUBL Place 1 each under the tongue daily.    Marland Kitchen docusate sodium (COLACE) 100 MG capsule Take 100 mg by mouth daily.    . ferrous sulfate 325 (65 FE) MG tablet Take 325 mg by mouth daily.    . Glucosamine-Chondroit-Vit C-Mn (GLUCOSAMINE 1500 COMPLEX PO) Take 1 tablet by mouth 2 (two) times daily.    . Omega-3 Krill Oil 500 MG CAPS Take 1 capsule by mouth daily.    . propranolol ER (INDERAL LA) 80 MG 24 hr capsule Take 1 capsule (80 mg total) by mouth daily. 30 capsule 0  . simvastatin (ZOCOR) 10 MG tablet Take 10 mg by mouth at bedtime.     No current facility-administered medications for this visit.     LABS/IMAGING: No results found for this or any previous visit (from the past 48 hour(s)). No results found.  WEIGHTS: Wt Readings from Last 3 Encounters:  06/01/17 167 lb 6.4 oz (75.9 kg)  10/02/16 161 lb (73 kg)  09/02/16 164 lb (74.4 kg)    VITALS: BP 124/78   Pulse 67   Ht 5' 7.5" (1.715 m)   Wt 167 lb 6.4 oz (75.9 kg)   LMP  (LMP  Unknown)   BMI 25.83 kg/m   EXAM: General appearance: alert and no distress Lungs: clear to auscultation bilaterally Heart: regular rate and rhythm, S1, S2 normal, no murmur, click, rub or gallop Extremities: extremities normal, atraumatic, no cyanosis or edema Neurologic: Grossly normal  EKG: Normal sinus rhythm at 67-personally reviewed  ASSESSMENT: 1. Symptomatic palpitations - PAC's, PVC's and ventricular bigeminy 2. Mild dyspnea and atypical chest pain 3. 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 Possible hypertension  PLAN: 1.   Dawn Underwood is doing well and is asymptomatic.  She exercises regularly without any chest pain or palpitations of significance.  It does sound like she may be having some reflux symptoms for which she could trial an H2 blocker or PPI over-the-counter.  If her symptoms worsen or become more frequent she may need GI evaluation.    Follow-up with me annually or sooner as necessary.  Pixie Casino, MD, [facility], [place] Director of the Advanced Lipid Disorders &  Cardiovascular Risk Reduction Clinic Diplomate of the American Board of Clinical Lipidology Attending Cardiologist  Direct Dial: [phone]  Fax: [phone]  Website:  [email or web address]  Nadean Corwin Mulki Roesler 06/01/2017, 9:25 AM

## 2017-06-01 NOTE — Patient Instructions (Signed)
Your physician wants you to follow-up in: 1 year with Dr. Hilty. You will receive a reminder letter in the mail two months in advance. If you don't receive a letter, please call our office to schedule the follow-up appointment.  

## 2017-06-12 ENCOUNTER — Ambulatory Visit
Admission: RE | Admit: 2017-06-12 | Discharge: 2017-06-12 | Disposition: A | Discharge status: Home / Self Care | Payer: PPO | Source: Ambulatory Visit | Attending: Family Medicine | Admitting: Family Medicine

## 2017-06-12 DIAGNOSIS — Z1231 Encounter for screening mammogram for malignant neoplasm of breast: Secondary | ICD-10-CM | POA: Diagnosis not present

## 2017-07-06 DIAGNOSIS — Z01419 Encounter for gynecological examination (general) (routine) without abnormal findings: Secondary | ICD-10-CM | POA: Diagnosis not present

## 2017-07-06 DIAGNOSIS — R3915 Urgency of urination: Secondary | ICD-10-CM | POA: Diagnosis not present

## 2017-07-06 DIAGNOSIS — Z1151 Encounter for screening for human papillomavirus (HPV): Secondary | ICD-10-CM | POA: Diagnosis not present

## 2017-07-06 DIAGNOSIS — N3945 Continuous leakage: Secondary | ICD-10-CM | POA: Diagnosis not present

## 2017-07-06 DIAGNOSIS — Z90711 Acquired absence of uterus with remaining cervical stump: Secondary | ICD-10-CM | POA: Diagnosis not present

## 2017-08-05 ENCOUNTER — Ambulatory Visit (INDEPENDENT_AMBULATORY_CARE_PROVIDER_SITE_OTHER): Payer: PPO | Admitting: *Deleted

## 2017-08-05 DIAGNOSIS — I83893 Varicose veins of bilateral lower extremities with other complications: Secondary | ICD-10-CM

## 2017-08-05 NOTE — Progress Notes (Signed)
X=.3% Sotradecol administered with a 27g butterfly.  Patient received a total of 12cc.  Pt came in for a clean up. Tried to get as many vessels as possible with 2 syringes. Easy access. Doesn't like sticks, but was still. She will need more tx in the future. Follow prn.  Photos: No. Malfunction occurred. Compression stockings applied: Yes.

## 2017-09-22 DIAGNOSIS — I10 Essential (primary) hypertension: Secondary | ICD-10-CM | POA: Diagnosis not present

## 2017-09-22 DIAGNOSIS — E7800 Pure hypercholesterolemia, unspecified: Secondary | ICD-10-CM | POA: Diagnosis not present

## 2017-09-24 DIAGNOSIS — Z0000 Encounter for general adult medical examination without abnormal findings: Secondary | ICD-10-CM | POA: Diagnosis not present

## 2017-09-24 DIAGNOSIS — R002 Palpitations: Secondary | ICD-10-CM | POA: Diagnosis not present

## 2017-09-24 DIAGNOSIS — M545 Low back pain: Secondary | ICD-10-CM | POA: Diagnosis not present

## 2017-09-24 DIAGNOSIS — E7800 Pure hypercholesterolemia, unspecified: Secondary | ICD-10-CM | POA: Diagnosis not present

## 2018-02-03 DIAGNOSIS — Z23 Encounter for immunization: Secondary | ICD-10-CM | POA: Diagnosis not present

## 2018-05-12 ENCOUNTER — Other Ambulatory Visit: Payer: Self-pay | Admitting: Family Medicine

## 2018-05-12 DIAGNOSIS — Z1231 Encounter for screening mammogram for malignant neoplasm of breast: Secondary | ICD-10-CM

## 2018-05-26 ENCOUNTER — Ambulatory Visit: Payer: PPO | Admitting: Internal Medicine

## 2018-05-26 ENCOUNTER — Encounter: Payer: Self-pay | Admitting: Internal Medicine

## 2018-05-26 VITALS — BP 114/76 | HR 57 | Ht 67.5 in | Wt 173.0 lb

## 2018-05-26 DIAGNOSIS — F419 Anxiety disorder, unspecified: Secondary | ICD-10-CM | POA: Diagnosis not present

## 2018-05-26 DIAGNOSIS — R002 Palpitations: Secondary | ICD-10-CM | POA: Diagnosis not present

## 2018-05-26 DIAGNOSIS — I83891 Varicose veins of right lower extremities with other complications: Secondary | ICD-10-CM

## 2018-05-26 NOTE — Patient Instructions (Signed)
Medication Instructions:  Continue current medications If you need a refill on your cardiac medications before your next appointment, please call your pharmacy.   Follow-Up: At [facility], you and your health needs are our priority.  As part of our continuing mission to provide you with exceptional heart care, we have created designated Provider Care Teams.  These Care Teams include your primary Cardiologist (physician) and Advanced Practice Providers (APPs -  Physician Assistants and Nurse Practitioners) who all work together to provide you with the care you need, when you need it. You will need a follow up appointment in 12 months.  Please call our office 2 months in advance to schedule this appointment.  You may see Kenneth C Hilty, MD or one of the following Advanced Practice Providers on your designated Care Team: Hao Meng, PA-C . Angela Duke, PA-C  Any Other Special Instructions Will Be Listed Below (If Applicable).    

## 2018-05-26 NOTE — Progress Notes (Signed)
OFFICE NOTE  Chief Complaint:  Annual follow-up  Primary Care Physician: Aletha Halim., PA-C  HPI:  Dawn Underwood is a 68 y.o. female who is currently referred to me for evaluation of palpitations. She reported that she's had palpitations on and off for years however recently had worsening symptoms after a bout of colitis with bright red blood per rectum. This ultimately resolved. She then had problems regulating her thyroid was referred to Dr. Dagmar Hait for further evaluation. She does have a history of graves disease. There is also underlying anxiety and of course palpitations in the past. Family history significant for some coronary disease in her grandparents and she has dyslipidemia and questionable hypertension (she's had both hypotension and hypertension at times, and may have white coat hypertension). In February she was taken the emergency department after an episode where she fell of flushing feeling over her body as well as her heart pounding out of her chest. She became very anxious about this and resented to the emergency department. She said she waited in the ER for several hours and her symptoms resolved prior to being completely evaluated. Ultimately she was told that this was a "panic attack". Since then she's had no other significant episodes but several other smaller episodes of palpitations. She also reports some left shoulder pain which is sharp and may be related to these palpitations. In addition there is some mild shortness of breath.  10/02/15  Dawn Underwood returns today for follow-up of her studies. She underwent an exercise treadmill stress test which was low risk for ischemia. She had an echocardiogram which showed EF 55-60% with normal systolic function, mild diastolic dysfunction trivial MR and mild TR. She did wear a monitor which demonstrated PVCs, bigeminal PVCs and PACs, likely the cause of her palpitations. She reports that they were particularly  bothersome this weekend however she was under significant stress and anxiety related to following around her grandchildren over the holiday.  10/29/2015  Dawn Underwood was seen back today in follow-up. She says her palpitations have nearly resolved on propranolol. She also thinks she's had improvement in her anxiety. She's not had take any additional Xanax since we last saw her month ago.  05/30/2016  Dawn Underwood returns today for follow-up. She reports a recently she's been having some more palpitations. In general are not nearly as bad as before she started propranolol but have become a little worse. She is struggling with some pain in her feet and has a heel spur. She says is difficult for her to walk and has been more anxious because of this. She is using Xanax but sparingly. In general that seems to help with her palpitations as well.  06/01/2017  Dawn Underwood was seen today in annual follow-up.  She reports her palpitations have been very well controlled.  Blood pressure is at goal.  She exercises 3-5 days a week without any limitations.  She reports 2 episodes over the past year of feeling of hot and flushed with some associated vomiting or diarrhea.  She is also had some morning vomiting after taking her medications and drinking a cup of coffee.  She typically does not have any food on her stomach that time.  It was thought that these might be hot flashes however she does not feel like there typically associated with that.  To me these episodes sound like reflux.  We talked about ways to try to reduce reflux and that she could consider over-the-counter H2  blockers or PPIs.  05/26/2018  Dawn Underwood is seen today in annual follow-up.  She is done well with regards her palpitations over the past year.  She continues to exercise at least 3 times a week.  She was exercising more but developed hip problems on the elliptical machine and her orthopedist told her to back off.  She did have some lab  work recently which showed total cholesterol of 157, triglycerides 95, HDL 58 and LDL 82.  She is on low-dose simvastatin.  She takes propranolol primarily for palpitations and has not had an issue with blood pressure.  PMHx:  Past Medical History:  Diagnosis Date  . Acute sinusitis, unspecified   . Anemia   . Cystocele or rectocele with complete uterine prolapse   . Graves' disease   . Hyperlipidemia   . Joint pain   . Light headedness   . Lower back pain   . Other malaise and fatigue     Past Surgical History:  Procedure Laterality Date  . ABDOMINAL HYSTERECTOMY    . bladder mesh    . BREAST BIOPSY Left   . CATARACT EXTRACTION     Bil  . TUBAL LIGATION      FAMHx:  Family History  Problem Relation Age of Onset  . Diabetes Father     SOCHx:   reports that she has never smoked. She has never used smokeless tobacco. She reports current alcohol use of about 4.0 standard drinks of alcohol per week. She reports that she does not use drugs.  ALLERGIES:  Allergies  Allergen Reactions  . Erythromycin Other (See Comments)    Pt doesn't remember    ROS: Pertinent items noted in HPI and remainder of comprehensive ROS otherwise negative.  HOME MEDS: Current Outpatient Medications  Medication Sig Dispense Refill  . aspirin EC 81 MG tablet Take 81 mg by mouth daily.    . calcium citrate-vitamin D (CITRACAL+D) 315-200 MG-UNIT tablet Take 1 tablet by mouth 2 (two) times daily. Take 1 tab by mouth in the morning and tabs in the evening    . Cholecalciferol (VITAMIN D3) 50 MCG (2000 UT) TABS Take 1 tablet by mouth daily.    . Coenzyme Q10 (CO Q 10) 100 MG CAPS Take 1 capsule by mouth daily.    . Cyanocobalamin (B-12) 2500 MCG SUBL Place 1 each under the tongue daily.    Marland Kitchen docusate sodium (COLACE) 100 MG capsule Take 100 mg by mouth daily.    . ferrous sulfate 325 (65 FE) MG tablet Take 325 mg by mouth daily.    . Glucosamine-Chondroit-Vit C-Mn (GLUCOSAMINE 1500 COMPLEX PO) Take  1 tablet by mouth 2 (two) times daily.    . Omega-3 Krill Oil 500 MG CAPS Take 1 capsule by mouth daily.    . propranolol ER (INDERAL LA) 80 MG 24 hr capsule Take 1 capsule (80 mg total) by mouth daily. 90 capsule 3  . simvastatin (ZOCOR) 10 MG tablet Take 10 mg by mouth at bedtime.     No current facility-administered medications for this visit.     LABS/IMAGING: No results found for this or any previous visit (from the past 48 hour(s)). No results found.  WEIGHTS: Wt Readings from Last 3 Encounters:  05/26/18 173 lb (78.5 kg)  06/01/17 167 lb 6.4 oz (75.9 kg)  10/02/16 161 lb (73 kg)    VITALS: BP 114/76   Pulse (!) 57   Ht 5' 7.5" (1.715 m)   Wt 173  lb (78.5 kg)   LMP  (LMP Unknown)   BMI 26.70 kg/m   EXAM: General appearance: alert and no distress Neck: no carotid bruit, no JVD and thyroid not enlarged, symmetric, no tenderness/mass/nodules Lungs: clear to auscultation bilaterally Heart: regular rate and rhythm, S1, S2 normal, no murmur, click, rub or gallop Abdomen: soft, non-tender; bowel sounds normal; no masses,  no organomegaly Extremities: extremities normal, atraumatic, no cyanosis or edema Pulses: 2+ and symmetric Skin: Skin color, texture, turgor normal. No rashes or lesions Neurologic: Grossly normal Psych: Pleasant  EKG: Sinus bradycardia 57, low voltage QRS-personally reviewed  ASSESSMENT: 1. Symptomatic palpitations - PAC's, PVC's and ventricular bigeminy 2. 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   Mrs. Melroy continues to do well without any recurrent palpitations of significance on the beta-blocker.  She denies any worsening shortness of breath or chest pain symptoms.  Blood pressure has been well controlled and there is no real evidence of hypertension.  Plan follow-up with me annually or sooner as necessary.  Pixie Casino, MD, [facility], [place] Director of the Advanced Lipid  Disorders &  Cardiovascular Risk Reduction Clinic Diplomate of the American Board of Clinical Lipidology Attending Cardiologist  Direct Dial: [phone]  Fax: [phone]  Website:  [email or web address] Esco 05/26/2018, 11:14 AM

## 2018-06-14 ENCOUNTER — Ambulatory Visit
Admission: RE | Admit: 2018-06-14 | Discharge: 2018-06-14 | Disposition: A | Discharge status: Home / Self Care | Payer: PPO | Source: Ambulatory Visit | Attending: Family Medicine | Admitting: Family Medicine

## 2018-06-14 DIAGNOSIS — Z1231 Encounter for screening mammogram for malignant neoplasm of breast: Secondary | ICD-10-CM

## 2018-06-16 ENCOUNTER — Other Ambulatory Visit: Payer: Self-pay | Admitting: *Deleted

## 2018-06-16 DIAGNOSIS — R002 Palpitations: Secondary | ICD-10-CM

## 2018-06-16 MED ORDER — PROPRANOLOL HCL ER 80 MG PO CP24: 80.0000 mg | ORAL_CAPSULE | Freq: Every day | ORAL | 3 refills | Status: DC

## 2018-06-28 DIAGNOSIS — D229 Melanocytic nevi, unspecified: Secondary | ICD-10-CM | POA: Diagnosis not present

## 2018-06-28 DIAGNOSIS — L570 Actinic keratosis: Secondary | ICD-10-CM | POA: Diagnosis not present

## 2018-07-07 DIAGNOSIS — L821 Other seborrheic keratosis: Secondary | ICD-10-CM | POA: Diagnosis not present

## 2018-09-30 DIAGNOSIS — Z1159 Encounter for screening for other viral diseases: Secondary | ICD-10-CM | POA: Diagnosis not present

## 2018-09-30 DIAGNOSIS — E7800 Pure hypercholesterolemia, unspecified: Secondary | ICD-10-CM | POA: Diagnosis not present

## 2018-10-06 ENCOUNTER — Other Ambulatory Visit: Payer: Self-pay | Admitting: Family Medicine

## 2018-10-06 DIAGNOSIS — Z0000 Encounter for general adult medical examination without abnormal findings: Secondary | ICD-10-CM | POA: Diagnosis not present

## 2018-10-06 DIAGNOSIS — R8290 Unspecified abnormal findings in urine: Secondary | ICD-10-CM | POA: Diagnosis not present

## 2018-10-06 DIAGNOSIS — E7800 Pure hypercholesterolemia, unspecified: Secondary | ICD-10-CM | POA: Diagnosis not present

## 2018-10-06 DIAGNOSIS — M8580 Other specified disorders of bone density and structure, unspecified site: Secondary | ICD-10-CM | POA: Diagnosis not present

## 2018-12-14 ENCOUNTER — Other Ambulatory Visit: Payer: Self-pay

## 2018-12-14 ENCOUNTER — Ambulatory Visit
Admission: RE | Admit: 2018-12-14 | Discharge: 2018-12-14 | Disposition: A | Discharge status: Home / Self Care | Payer: PPO | Source: Ambulatory Visit | Attending: Family Medicine | Admitting: Family Medicine

## 2018-12-14 DIAGNOSIS — M8588 Other specified disorders of bone density and structure, other site: Secondary | ICD-10-CM | POA: Diagnosis not present

## 2018-12-14 DIAGNOSIS — Z780 Asymptomatic menopausal state: Secondary | ICD-10-CM | POA: Diagnosis not present

## 2018-12-14 DIAGNOSIS — M8580 Other specified disorders of bone density and structure, unspecified site: Secondary | ICD-10-CM

## 2019-01-12 DIAGNOSIS — N390 Urinary tract infection, site not specified: Secondary | ICD-10-CM | POA: Diagnosis not present

## 2019-01-12 DIAGNOSIS — L570 Actinic keratosis: Secondary | ICD-10-CM | POA: Diagnosis not present

## 2019-01-25 DIAGNOSIS — Z23 Encounter for immunization: Secondary | ICD-10-CM | POA: Diagnosis not present

## 2019-02-01 ENCOUNTER — Other Ambulatory Visit: Payer: Self-pay

## 2019-02-01 ENCOUNTER — Emergency Department (HOSPITAL_COMMUNITY)
Admission: EM | Admit: 2019-02-01 | Discharge: 2019-02-01 | Disposition: A | Discharge status: Home / Self Care | Payer: PPO | Attending: Emergency Medicine | Admitting: Emergency Medicine

## 2019-02-01 ENCOUNTER — Encounter (HOSPITAL_COMMUNITY): Payer: Self-pay | Admitting: Emergency Medicine

## 2019-02-01 ENCOUNTER — Emergency Department (HOSPITAL_COMMUNITY): Payer: PPO

## 2019-02-01 DIAGNOSIS — R195 Other fecal abnormalities: Secondary | ICD-10-CM | POA: Diagnosis not present

## 2019-02-01 DIAGNOSIS — Z7982 Long term (current) use of aspirin: Secondary | ICD-10-CM | POA: Diagnosis not present

## 2019-02-01 DIAGNOSIS — R1030 Lower abdominal pain, unspecified: Secondary | ICD-10-CM | POA: Diagnosis present

## 2019-02-01 DIAGNOSIS — Z79899 Other long term (current) drug therapy: Secondary | ICD-10-CM | POA: Insufficient documentation

## 2019-02-01 DIAGNOSIS — R197 Diarrhea, unspecified: Secondary | ICD-10-CM | POA: Insufficient documentation

## 2019-02-01 DIAGNOSIS — R109 Unspecified abdominal pain: Secondary | ICD-10-CM | POA: Diagnosis not present

## 2019-02-01 LAB — COMPREHENSIVE METABOLIC PANEL
ALT: 18 U/L (ref 0–44)
AST: 25 U/L (ref 15–41)
Albumin: 4.2 g/dL (ref 3.5–5.0)
Alkaline Phosphatase: 57 U/L (ref 38–126)
Anion gap: 9 (ref 5–15)
BUN: 14 mg/dL (ref 8–23)
CO2: 24 mmol/L (ref 22–32)
Calcium: 9.5 mg/dL (ref 8.9–10.3)
Chloride: 107 mmol/L (ref 98–111)
Creatinine, Ser: 0.7 mg/dL (ref 0.44–1.00)
GFR calc Af Amer: >60 mL/min (ref >60)
GFR calc non Af Amer: >60 mL/min (ref >60)
Glucose, Bld: 96 mg/dL (ref 70–99)
Potassium: 3.8 mmol/L (ref 3.5–5.1)
Sodium: 140 mmol/L (ref 135–145)
Total Bilirubin: 1.2 mg/dL (ref 0.3–1.2)
Total Protein: 7.6 g/dL (ref 6.5–8.1)

## 2019-02-01 LAB — POC OCCULT BLOOD, ED: Fecal Occult Bld: POSITIVE — AB

## 2019-02-01 LAB — CBC WITH DIFFERENTIAL/PLATELET
Abs Immature Granulocytes: 0.02 10*3/uL (ref 0.00–0.07)
Basophils Absolute: 0 10*3/uL (ref 0.0–0.1)
Basophils Relative: 1 %
Eosinophils Absolute: 0.2 10*3/uL (ref 0.0–0.5)
Eosinophils Relative: 4 %
HCT: 40.4 % (ref 36.0–46.0)
Hemoglobin: 12.9 g/dL (ref 12.0–15.0)
Immature Granulocytes: 0 %
Lymphocytes Relative: 24 %
Lymphs Abs: 1.3 10*3/uL (ref 0.7–4.0)
MCH: 28.6 pg (ref 26.0–34.0)
MCHC: 31.9 g/dL (ref 30.0–36.0)
MCV: 89.6 fL (ref 80.0–100.0)
Monocytes Absolute: 0.5 10*3/uL (ref 0.1–1.0)
Monocytes Relative: 9 %
Neutro Abs: 3.3 10*3/uL (ref 1.7–7.7)
Neutrophils Relative %: 62 %
Platelets: 229 10*3/uL (ref 150–400)
RBC: 4.51 MIL/uL (ref 3.87–5.11)
RDW: 13.2 % (ref 11.5–15.5)
WBC: 5.4 10*3/uL (ref 4.0–10.5)
nRBC: 0 % (ref 0.0–0.2)

## 2019-02-01 LAB — LIPASE, BLOOD: Lipase: 37 U/L (ref 11–51)

## 2019-02-01 MED ORDER — SODIUM CHLORIDE (PF) 0.9 % IJ SOLN
INTRAMUSCULAR | Status: AC
  Filled 2019-02-01: qty 50

## 2019-02-01 MED ORDER — SODIUM CHLORIDE 0.9 % IV BOLUS
500.0000 mL | Freq: Once | INTRAVENOUS | Status: AC
  Administered 2019-02-01: 19:00:00 500 mL via INTRAVENOUS

## 2019-02-01 MED ORDER — IOHEXOL 300 MG/ML  SOLN
100.0000 mL | Freq: Once | INTRAMUSCULAR | Status: AC | PRN
  Administered 2019-02-01: 18:00:00 100 mL via INTRAVENOUS

## 2019-02-01 MED ORDER — SODIUM CHLORIDE 0.9 % IV BOLUS
500.0000 mL | Freq: Once | INTRAVENOUS | Status: AC
  Administered 2019-02-01: 500 mL via INTRAVENOUS

## 2019-02-01 NOTE — ED Notes (Signed)
Pt and husband verbalized discharge instructions and follow up care. Alert and ambulatory. No IV. Husband is driving pt home.

## 2019-02-01 NOTE — Discharge Instructions (Addendum)
You were seen in the emergency department today for abdominal pain and blood in your stool.  Your work-up in the ER was overall reassuring.  Your labs did not show substantial abnormalities other than that your was blood in your stool..  Your CT scan did not show substantial abnormalities.  We are unsure the exact cause of your symptoms, for this reason we would like you to call it very closely within the next 48 hours with a GI doctor.  Please call with our GI or your previous GI doctor for close follow-up.  Return to the emergency department for new or worsening symptoms including but not limited to worsening pain, increased blood, dark/tarry stools, vomiting blood, lightheadedness, dizziness, passing out, or any other concerns.

## 2019-02-01 NOTE — ED Triage Notes (Signed)
Pt reports that yesterday after eating she started having abd pains then had diarrhea. Reports had some blood in stool yesterday and then again today. reports takes daily ASA.

## 2019-02-01 NOTE — ED Notes (Signed)
Patient transported to CT 

## 2019-02-01 NOTE — ED Notes (Signed)
Patient made aware we need urine sample and stool sample.

## 2019-02-01 NOTE — ED Notes (Signed)
Husband at bedside.  

## 2019-02-01 NOTE — ED Provider Notes (Signed)
[facility] DEPT Provider Note   CSN: [id number] Arrival date & time: 02/01/19  1020     History   Chief Complaint Chief Complaint  Patient presents with  . Abdominal Pain  . Rectal Bleeding    HPI Dawn Underwood is a 68 y.o. female with a hx of hyperlipidemia, anemia, Graves disease, GERD, prior abdominal hysterectomy & tubal ligation who presents to the ED with complaints of abdominal pain and bloody diarrhea starting yesterday.  Patient states that shortly after eating potato salad and deviled eggs that she does not believe were sitting out for very long she developed some lower abdominal cramping as if she needed to have a bowel movement with subsequent diarrhea.  She states she has had a few loose stools and then started to note blood on the toilet paper/toilet bowl.  She did have one bowel movement which she described as bloody diarrhea, but no melena.  States she is had a total of about 6 diarrhea bowel movements.  She states her abdominal pain is intermittent, worse prior to needing to have a bowel movement, no other alleviating or aggravating factors.  No intervention prior to arrival.  She has had some nausea without vomiting.  Denies fever, chills, emesis, dysuria, melena, chest pain, dyspnea, URI symptoms, or vaginal bleeding/discharge.  Denies lightheadedness, dizziness, or syncope.  She was recently on Macrobid for a UTI which she finished about a week ago.  She has not recently traveled anywhere or been hospitalized. Last colonoscopy 2014 reported to be normal by the patient.     HPI  Past Medical History:  Diagnosis Date  . Acute sinusitis, unspecified   . Anemia   . Cystocele or rectocele with complete uterine prolapse   . Graves' disease   . Hyperlipidemia   . Joint pain   . Light headedness   . Lower back pain   . Other malaise and fatigue     Patient Active Problem List   Diagnosis Date Noted  . Gastroesophageal reflux disease  06/01/2017  . Lower back pain   . Light headedness   . Joint pain   . Graves' disease   . Anemia   . Acute sinusitis, unspecified   . Anxiety 10/29/2015  . Palpitations 08/23/2015  . DOE (dyspnea on exertion) 08/23/2015  . Chest pain 08/23/2015  . BRBPR (bright red blood per rectum) 04/16/2015  . Colitis, acute 04/16/2015  . Hyperlipidemia 04/16/2015  . Colitis 04/16/2015    Past Surgical History:  Procedure Laterality Date  . ABDOMINAL HYSTERECTOMY    . bladder mesh    . BREAST BIOPSY Left   . CATARACT EXTRACTION     Bil  . TUBAL LIGATION       OB History   No obstetric history on file.      Home Medications    Prior to Admission medications   Medication Sig Start Date End Date Taking? Authorizing Provider  aspirin EC 81 MG tablet Take 81 mg by mouth daily.    [provider]  calcium citrate-vitamin D (CITRACAL+D) 315-200 MG-UNIT tablet Take 1 tablet by mouth 2 (two) times daily. Take 1 tab by mouth in the morning and tabs in the evening    [provider]  Cholecalciferol (VITAMIN D3) 50 MCG (2000 UT) TABS Take 1 tablet by mouth daily.    [provider]  Coenzyme Q10 (CO Q 10) 100 MG CAPS Take 1 capsule by mouth daily.    [provider]  Cyanocobalamin (B-12) 2500 MCG SUBL Place 1 each under the tongue daily.    [provider]  docusate sodium (COLACE) 100 MG capsule Take 100 mg by mouth daily.    [provider]  ferrous sulfate 325 (65 FE) MG tablet Take 325 mg by mouth daily.    [provider]  Glucosamine-Chondroit-Vit C-Mn (GLUCOSAMINE 1500 COMPLEX PO) Take 1 tablet by mouth 2 (two) times daily.    [provider]  Omega-3 Krill Oil 500 MG CAPS Take 1 capsule by mouth daily.    [provider]  propranolol ER (INDERAL LA) 80 MG 24 hr capsule Take 1 capsule (80 mg total) by mouth daily. 06/16/18   Hilty, Nadean Corwin, MD  simvastatin (ZOCOR) 10 MG tablet Take 10 mg by mouth at  bedtime.    [provider]    Family History Family History  Problem Relation Age of Onset  . Diabetes Father     Social History Social History   Tobacco Use  . Smoking status: Never Smoker  . Smokeless tobacco: Never Used  Substance Use Topics  . Alcohol use: Yes    Alcohol/week: 4.0 standard drinks    Types: 4 Glasses of wine per week    Comment: socially on weekends  . Drug use: No     Allergies   Erythromycin   Review of Systems Review of Systems  Constitutional: Negative for chills and fever.  HENT: Negative for congestion, ear pain and sore throat.   Respiratory: Negative for cough and shortness of breath.   Cardiovascular: Negative for chest pain.  Gastrointestinal: Positive for abdominal pain, blood in stool, diarrhea and nausea. Negative for constipation and vomiting.  Genitourinary: Negative for dysuria, vaginal bleeding and vaginal discharge.  Neurological: Negative for dizziness, syncope, weakness, light-headedness and numbness.  All other systems reviewed and are negative.    Physical Exam Updated Vital Signs BP (!) 134/99   Pulse 75   Temp 98.1 F (36.7 C) (Oral)   Resp 19   LMP  (LMP Unknown)   SpO2 98%   Physical Exam Vitals signs and nursing note reviewed.  Constitutional:      General: She is not in acute distress.    Appearance: She is well-developed. She is not toxic-appearing.  HENT:     Head: Normocephalic and atraumatic.  Eyes:     General:        Right eye: No discharge.        Left eye: No discharge.     Conjunctiva/sclera: Conjunctivae normal.  Neck:     Musculoskeletal: Neck supple.  Cardiovascular:     Rate and Rhythm: Normal rate and regular rhythm.  Pulmonary:     Effort: Pulmonary effort is normal. No respiratory distress.     Breath sounds: Normal breath sounds. No wheezing, rhonchi or rales.  Abdominal:     General: There is no distension.     Palpations: Abdomen is soft.     Tenderness: There is  abdominal tenderness in the right lower quadrant and left lower quadrant. There is no guarding or rebound.  Genitourinary:    Comments: RN Rebekah present as chaperone.  There are external hemorrhoids present without thrombosis.  DRE with somewhat maroon appearing stool.  No melena. Skin:    General: Skin is warm and dry.     Findings: No rash.  Neurological:     Mental Status: She is alert.     Comments: Clear speech.   Psychiatric:  Behavior: Behavior normal.    ED Treatments / Results  Labs (all labs ordered are listed, but only abnormal results are displayed) Labs Reviewed  POC OCCULT BLOOD, ED - Abnormal; Notable for the following components:      Result Value   Fecal Occult Bld POSITIVE (*)    All other components within normal limits  C DIFFICILE QUICK SCREEN W PCR REFLEX  CBC WITH DIFFERENTIAL/PLATELET  COMPREHENSIVE METABOLIC PANEL  LIPASE, BLOOD  GI PATHOGEN PANEL BY PCR, STOOL   EKG None  Radiology Ct Abdomen Pelvis W Contrast  Result Date: 02/01/2019 CLINICAL DATA:  68 year old with acute onset of LOWER abdominal pain and rectal bleeding that began yesterday. Surgical history includes abdominal hysterectomy and cystopexy. EXAM: CT ABDOMEN AND PELVIS WITH CONTRAST TECHNIQUE: Multidetector CT imaging of the abdomen and pelvis was performed using the standard protocol following bolus administration of intravenous contrast. CONTRAST:  1105mL OMNIPAQUE IOHEXOL 300 MG/ML IV. COMPARISON:  04/16/2015. FINDINGS: Lower chest: Minimal scarring and bronchiectasis involving lower lobes, the bronchiectasis a new finding since 2016. Visualized lung bases otherwise clear. Normal heart size. Hepatobiliary: Liver normal in size and appearance. Gallbladder normal in appearance without calcified gallstones. No biliary ductal dilation. Pancreas: Normal in appearance without evidence of mass, ductal dilation, or inflammation. Spleen: Normal in size and appearance. Adrenals/Urinary  Tract: Normal appearing adrenal glands. Kidneys normal in size and appearance without focal parenchymal abnormality. No hydronephrosis. No evidence of urinary tract calculi. Normal appearing urinary bladder. Stomach/Bowel: Stomach normal in appearance for the degree of distention. Normal-appearing small bowel. Normal-appearing colon with expected stool burden. Descending colon, sigmoid colon and rectum decompressed. Normal appearing decompressed appendix in the RIGHT mid pelvis. Vascular/Lymphatic: Mild aortoiliac atherosclerosis without evidence of aneurysm. Normal-appearing portal venous and systemic venous systems. No pathologic lymphadenopathy. Reproductive: Surgically absent uterus. No adnexal masses. Other: Postsurgical changes related to the prior cystopexy. Musculoskeletal: Osseous demineralization. Degenerative disc disease and spondylosis involving the visualized lower thoracic spine. Diffuse facet degenerative changes involving the lumbar spine. Mild degenerative changes in both hips. No acute findings. IMPRESSION: 1. No acute abnormalities involving the abdomen or pelvis. 2. Minimal scarring and bronchiectasis involving the lower lobes of both lungs, the bronchiectasis a new finding since 2016. Aortic Atherosclerosis (ICD10-I70.0). Electronically Signed   By: Evangeline Dakin M.D.   On: 02/01/2019 19:05    Procedures Procedures (including critical care time)  Medications Ordered in ED Medications - No data to display   Initial Impression / Assessment and Plan / ED Course  I have reviewed the triage vital signs and the nursing notes.  Pertinent labs & imaging results that were available during my care of the patient were reviewed by me and considered in my medical decision making (see chart for details).   Patient presents to the emergency department with complaints of abdominal pain as well as diarrhea w/ blood.  Nontoxic-appearing, no apparent distress, vitals without significant  abnormality.  Patient is not tachycardic, hypotensive, or febrile.  No conjunctival pallor.  Her abdomen is soft, however she does have tenderness to the lower quadrants without peritoneal signs.  DRE with somewhat hematochezia type stool, no melena, fecal occult positive.   Work-up per triage thus far has been reviewed: CBC: No leukocytosis or leukopenia.  No anemia-hgb/hct improved from prior labs on record. Lipase: WNL  Plan for CT abdomen/pelvis for further assessment.  Patient declining any pain or nausea medications currently, will administer fluids.  We have also ordered C. difficile/stool PCR panel if patient is  able to provide stool sample given she has been on antibiotics and did eat potentially  questionable food.  CT A/P:  1. No acute abnormalities involving the abdomen or pelvis. 2. Minimal scarring and bronchiectasis involving the lower lobes of both lungs, the bronchiectasis a new finding since 2016. Aortic Atherosclerosis   CT w/o colitis/diverticulitis/perf Patient's vitals have remained stable.  Hgb/hct increased from prior- not anemic. BUN WNL. No melena. Unable to provide stool sample, was on macrobid- no other abx, with this being her recent abx & inability to have a BM in the department feel c.diff is less likely.  Repeat abdominal exam remains w/o peritoneal signs.  Overall appears safe for discharge home w/ close GI follow up and strict return precautions which she is agreeable to. We discussed results, treatment plan, need for follow-up, and return precautions with the patient & her husband @ bedside. Provided opportunity for questions, patient & her husband confirmed understanding and are in agreement with plan.   This is a shared visit with supervising physician Dr. Roslynn Amble who has independently evaluated patient & provided guidance in evaluation/management/disposition, in agreement with care   Vitals:   02/01/19 1846 02/01/19 1900  BP: 123/75 122/71  Pulse: (!) 57  (!) 58  Resp: 18 17  Temp:    SpO2: 100% 100%    Final Clinical Impressions(s) / ED Diagnoses   Final diagnoses:  Lower abdominal pain  Diarrhea, unspecified type  Fecal occult blood test positive    ED Discharge Orders    None       Amaryllis Dyke, PA-C 02/01/19 2042    Lucrezia Starch, MD 02/02/19 0104

## 2019-02-02 ENCOUNTER — Telehealth: Payer: Self-pay | Admitting: Gastroenterology

## 2019-02-02 NOTE — Telephone Encounter (Signed)
FYI- Spoke to the patient who reports diarrhea (black stools) and abdominal pain and cramping 9/27 and 9/28. Denies weakness, lightheadedness, dizziness or headache. The patient went to the ED for evaluation on 9/29 (+ hemoccult). The patient states today she had two semi-formed "brown and black" stools with resolved abd pain. The patient reported she was on an antibiotic 3 weeks ago for a "bladder infection." The patient could not recall the name of the medication. The patient had not been seen since 09/2015 so an appointment was scheduled with AE on 10/6 at 1:30 pm.

## 2019-02-02 NOTE — Telephone Encounter (Signed)
Error for Doc of Day.   Should have been routed to "Dr. Loletha Carrow"

## 2019-02-03 NOTE — Telephone Encounter (Signed)
Hard to say what is going on with her, but sounds like it is subsiding. Review of hospital labs shows normal kidney function and electrolytes, normal hemoglobin.  CT scan unrevealing.  Office visit next week looks appropriate.

## 2019-02-03 NOTE — Telephone Encounter (Signed)
Noted  

## 2019-02-08 ENCOUNTER — Other Ambulatory Visit: Payer: Self-pay

## 2019-02-08 ENCOUNTER — Other Ambulatory Visit (INDEPENDENT_AMBULATORY_CARE_PROVIDER_SITE_OTHER): Payer: PPO

## 2019-02-08 ENCOUNTER — Encounter: Payer: Self-pay | Admitting: Physician Assistant

## 2019-02-08 ENCOUNTER — Ambulatory Visit: Payer: PPO | Admitting: Physician Assistant

## 2019-02-08 VITALS — BP 110/70 | HR 68 | Temp 97.8°F | Ht 67.0 in | Wt 171.0 lb

## 2019-02-08 DIAGNOSIS — Z8639 Personal history of other endocrine, nutritional and metabolic disease: Secondary | ICD-10-CM | POA: Diagnosis not present

## 2019-02-08 DIAGNOSIS — R109 Unspecified abdominal pain: Secondary | ICD-10-CM | POA: Diagnosis not present

## 2019-02-08 DIAGNOSIS — K921 Melena: Secondary | ICD-10-CM | POA: Diagnosis not present

## 2019-02-08 LAB — IBC + FERRITIN
Ferritin: 123.9 ng/mL (ref 10.0–291.0)
Iron: 76 ug/dL (ref 42–145)
Saturation Ratios: 26.4 % (ref 20.0–50.0)
Transferrin: 206 mg/dL — ABNORMAL LOW (ref 212.0–360.0)

## 2019-02-08 MED ORDER — SUPREP BOWEL PREP KIT 17.5-3.13-1.6 GM/177ML PO SOLN: 1.0000 | ORAL | 0 refills | Status: DC

## 2019-02-08 MED ORDER — DICYCLOMINE HCL 10 MG PO CAPS: 10.0000 mg | ORAL_CAPSULE | Freq: Four times a day (QID) | ORAL | 6 refills | Status: DC | PRN

## 2019-02-08 NOTE — Progress Notes (Signed)
Subjective:    Patient ID: Dawn Underwood, female    DOB: [birth year], 68 y.o.   MRN: [id number]  HPI Dawn Underwood is a 68 year old white female, former patient of Dr. Delfin Edis, who was seen by Dr. Loletha Carrow in 2017.  She comes in today for follow-up after recent ER visit on 02/01/2019 where she presented with acute abdominal pain cramping and bloody diarrhea. She says she had onset of symptoms on 01/31/2019.  She had 8 and some deviled eggs and potato salad several hours previously.  She describes fairly severe abdominal cramping, feeling hot all over nauseated without vomiting and then having episodes of diarrhea with bright red blood. A few weeks prior to that she had taken a course of Macrobid for UTI She underwent CT scan during ER visit which was read as normal. CBC was normal as were chemistries.  She was documented to be heme positive.  No stool cultures ordered. Patient says her symptoms very quickly resolved by the following day, she remains quite concerned.  She says she has been having intermittent episodes occurring every 2 to 3 weeks with a pattern of not having a bowel movement for several days followed by abdominal cramping, feeling hot sometimes nauseated and then evacuates her bowel with multiple bowel movements eventually becoming loose. She also had an episode a few years ago very similar to above with associated blood in her stool.  She is not of aware of any particular triggers.  Last colonoscopy December 2014 per Dr. Olevia Perches was normal. Patient mentions that she has been taking an iron supplement because she was told by the blood bank that she was anemic at one point in the past.  She has not had any recent iron studies done.  She does feel that the iron makes her constipated..  She generally uses Colace as needed.  Review of Systems Pertinent positive and negative review of systems were noted in the above HPI section.  All other review of systems was otherwise  negative.  Outpatient Encounter Medications as of 02/08/2019  Medication Sig   aspirin EC 81 MG tablet Take 81 mg by mouth daily.   calcium citrate-vitamin D (CITRACAL+D) 315-200 MG-UNIT tablet Take 1 tablet by mouth 2 (two) times daily. Take 1 tab by mouth in the morning and tabs in the evening   Cholecalciferol (VITAMIN D3) 50 MCG (2000 UT) TABS Take 1 tablet by mouth daily.   Coenzyme Q10 (CO Q 10) 100 MG CAPS Take 1 capsule by mouth daily.   Cyanocobalamin (B-12) 2500 MCG SUBL Place 1 each under the tongue daily.   docusate sodium (COLACE) 100 MG capsule Take 100 mg by mouth daily.   ferrous sulfate 325 (65 FE) MG tablet Take 325 mg by mouth daily.   Glucosamine-Chondroit-Vit C-Mn (GLUCOSAMINE 1500 COMPLEX PO) Take 1 tablet by mouth 2 (two) times daily.   Omega 3-6-9 Fatty Acids (OMEGA 3-6-9 COMPLEX PO) Take by mouth.   propranolol ER (INDERAL LA) 80 MG 24 hr capsule Take 1 capsule (80 mg total) by mouth daily.   simvastatin (ZOCOR) 10 MG tablet Take 10 mg by mouth at bedtime.   dicyclomine (BENTYL) 10 MG capsule Take 1 capsule (10 mg total) by mouth every 6 (six) hours as needed for spasms.   SUPREP BOWEL PREP KIT 17.5-3.13-1.6 GM/177ML SOLN Take 1 kit by mouth as directed. For colonoscopy prep   [DISCONTINUED] Omega-3 Krill Oil 500 MG CAPS Take 1 capsule by mouth daily.   No facility-administered encounter  medications on file as of 02/08/2019.    Allergies  Allergen Reactions   Erythromycin Other (See Comments)    Pt doesn't remember   Patient Active Problem List   Diagnosis Date Noted   Gastroesophageal reflux disease 06/01/2017   Lower back pain    Light headedness    Joint pain    Graves' disease    Anemia    Acute sinusitis, unspecified    Anxiety 10/29/2015   Palpitations 08/23/2015   DOE (dyspnea on exertion) 08/23/2015   Chest pain 08/23/2015   BRBPR (bright red blood per rectum) 04/16/2015   Colitis, acute 04/16/2015   Hyperlipidemia  04/16/2015   Colitis 04/16/2015   Social History   Socioeconomic History   Marital status: Married    Spouse name: Not on file   Number of children: Not on file   Years of education: Not on file   Highest education level: Not on file  Occupational History   Not on file  Social Needs   Financial resource strain: Not on file   Food insecurity    Worry: Not on file    Inability: Not on file   Transportation needs    Medical: Not on file    Non-medical: Not on file  Tobacco Use   Smoking status: Never Smoker   Smokeless tobacco: Never Used  Substance and Sexual Activity   Alcohol use: Yes    Alcohol/week: 4.0 standard drinks    Types: 4 Glasses of wine per week    Comment: socially on weekends   Drug use: No   Sexual activity: Not on file  Lifestyle   Physical activity    Days per week: Not on file    Minutes per session: Not on file   Stress: Not on file  Relationships   Social connections    Talks on phone: Not on file    Gets together: Not on file    Attends religious service: Not on file    Active member of club or organization: Not on file    Attends meetings of clubs or organizations: Not on file    Relationship status: Not on file   Intimate partner violence    Fear of current or ex partner: Not on file    Emotionally abused: Not on file    Physically abused: Not on file    Forced sexual activity: Not on file  Other Topics Concern   Not on file  Social History Narrative   Not on file    Dawn Underwood's family history includes Diabetes in her father.      Objective:    Vitals:   02/08/19 1335  BP: 110/70  Pulse: 68  Temp: 97.8 F (36.6 C)    Physical Exam Well-developed well-nourished female in no acute distress accompanied by her husband height, Weight, 171 BMI 26.78  HEENT; nontraumatic normocephalic, EOMI, PER R LA, sclera anicteric. Oropharynx; not examined/mask/COVID Neck; supple, no JVD Cardiovascular; regular rate  and rhythm with S1-S2, no murmur rub or gallop Pulmonary; Clear bilaterally Abdomen; soft, nontender, nondistended, no palpable mass or hepatosplenomegaly, bowel sounds are active Rectal; not done today Skin; benign exam, no jaundice rash or appreciable lesions Extremities; no clubbing cyanosis or edema skin warm and dry Neuro/Psych; alert and oriented x4, grossly nonfocal mood and affect appropriate       Assessment & Plan:   #74 68 year old white female with recent episode of acute abdominal pain/cramping, nausea mild diaphoresis and diarrhea with hematochezia.  Episode lasted about 36 hours and has not recurred. CT scan of the abdomen and pelvis unremarkable with no evidence of colitis and labs were normal other than documented heme positive stool.  Patient describes recurrent episodes every 2 to 3 weeks with inability to have a bowel movement for several days followed by abdominal cramping, sometimes nausea and complete bowel evacuation with loose stool  She may have had a mild episode of infectious colitis, or segmental ischemic colitis.  Her more frequent episodes sound like IBS.  #2 heme positive stool-again may have been due to a transient segmental colitis, rule out internal hemorrhoids, rule out occult lesion  #3 GERD #4 history of Graves' disease #5 previous history of anemia  Plan; Check iron studies today, if levels normal discontinue oral iron supplement Start fiber supplement daily. Trial of Levsin sublingual every 6 hours as needed at onset of symptoms Patient will be scheduled for colonoscopy.  She stated she preferred a female gastroenterologist, and therefore will schedule Colonoscopy with Dr. Tarri Glenn, who will then assume her GI care.  Procedure was discussed in detail with the patient and her husband including indications risks and benefits and she is agreeable to proceed.   Sadiya Underwood S Loveah Like PA-C 02/08/2019   Cc: Aletha Halim., PA-C

## 2019-02-08 NOTE — Patient Instructions (Addendum)
Your provider has requested that you go to the basement level for lab work before leaving today. Press "B" on the elevator. The lab is located at the first door on the left as you exit the elevator.  You have been scheduled for a colonoscopy. Please follow written instructions given to you at your visit today.  Please pick up your prep supplies at the pharmacy within the next 1-3 days. If you use inhalers (even only as needed), please bring them with you on the day of your procedure. Your physician has requested that you go to www.startemmi.com and enter the access code given to you at your visit today. This web site gives a general overview about your procedure. However, you should still follow specific instructions given to you by our office regarding your preparation for the procedure.  We have sent the following medications to your pharmacy for you to pick up at your convenience: antispasmotic  If you are age 86 or older, your body mass index should be between 23-30. Your Body mass index is 26.78 kg/m. If this is out of the aforementioned range listed, please consider follow up with your Primary Care Provider.  If you are age 48 or younger, your body mass index should be between 19-25. Your Body mass index is 26.78 kg/m. If this is out of the aformentioned range listed, please consider follow up with your Primary Care Provider.

## 2019-02-09 NOTE — Progress Notes (Signed)
____________________________________________________________  Attending physician addendum:  Thank you for sending this case to me. I have reviewed the entire note, and the outlined plan seems appropriate.  Ashutosh Dieguez Danis, MD  ____________________________________________________________  

## 2019-02-09 NOTE — Progress Notes (Signed)
Reviewed. I agree with documentation including the assessment and plan.  Juno Alers L. Emelie Newsom, MD, MPH 

## 2019-03-16 ENCOUNTER — Encounter: Payer: Self-pay | Admitting: Gastroenterology

## 2019-03-22 ENCOUNTER — Encounter: Payer: Self-pay | Admitting: Gastroenterology

## 2019-03-22 ENCOUNTER — Other Ambulatory Visit: Payer: Self-pay

## 2019-03-22 ENCOUNTER — Ambulatory Visit (AMBULATORY_SURGERY_CENTER): Payer: PPO | Admitting: Gastroenterology

## 2019-03-22 VITALS — BP 102/45 | HR 57 | Temp 98.2°F | Resp 12 | Ht 67.0 in | Wt 171.0 lb

## 2019-03-22 DIAGNOSIS — D122 Benign neoplasm of ascending colon: Secondary | ICD-10-CM

## 2019-03-22 DIAGNOSIS — K921 Melena: Secondary | ICD-10-CM

## 2019-03-22 DIAGNOSIS — R195 Other fecal abnormalities: Secondary | ICD-10-CM

## 2019-03-22 DIAGNOSIS — K648 Other hemorrhoids: Secondary | ICD-10-CM | POA: Diagnosis not present

## 2019-03-22 DIAGNOSIS — R109 Unspecified abdominal pain: Secondary | ICD-10-CM | POA: Diagnosis not present

## 2019-03-22 DIAGNOSIS — D123 Benign neoplasm of transverse colon: Secondary | ICD-10-CM

## 2019-03-22 MED ORDER — SODIUM CHLORIDE 0.9 % IV SOLN: 500.0000 mL | Freq: Once | INTRAVENOUS | Status: DC

## 2019-03-22 NOTE — Progress Notes (Signed)
Temp check by:JB Vital check by:CW  The medical and surgical history was reviewed and verified with the patient. 

## 2019-03-22 NOTE — Progress Notes (Signed)
To PACU< VSS. Report to [email or web address].tb 

## 2019-03-22 NOTE — Op Note (Signed)
[place] Patient Name: Dawn Underwood Procedure Date: 03/22/2019 8:36 AM MRN: [id number] Endoscopist: Thornton Park MD, MD Age: 68 Referring MD:  Date of Birth: 30-Sep-1950 Gender: Female Account #: [id number] Procedure:                Colonoscopy Indications:              Change in bowel habits                           Recurrent episodes every 2-3 weeks with inability                            to have a bowel movement for several days followed                            by abdominal cramping, sometimes nausea, complete                            bowel evacuation with loose stool                           Normal screening colonoscopy with Dr. Olevia Perches in                            2006 and 2014 Medicines:                Monitored Anesthesia Care Procedure:                Pre-Anesthesia Assessment:                           - Prior to the procedure, a History and Physical                            was performed, and patient medications and                            allergies were reviewed. The patient's tolerance of                            previous anesthesia was also reviewed. The risks                            and benefits of the procedure and the sedation                            options and risks were discussed with the patient.                            All questions were answered, and informed consent                            was obtained. Prior Anticoagulants: The patient has  taken no previous anticoagulant or antiplatelet                            agents. ASA Grade Assessment: II - A patient with                            mild systemic disease. After reviewing the risks                            and benefits, the patient was deemed in                            satisfactory condition to undergo the procedure.                           After obtaining informed consent, the colonoscope                            was  passed under direct vision. Throughout the                            procedure, the patient's blood pressure, pulse, and                            oxygen saturations were monitored continuously. The                            Colonoscope was introduced through the anus and                            advanced to the the terminal ileum, with                            identification of the appendiceal orifice and IC                            valve. A second forward view of the right colon was                            performed. The colonoscopy was somewhat difficult                            due to a redundant colon and significant looping.                            Successful completion of the procedure was aided by                            applying abdominal pressure. The patient tolerated                            the procedure well. The quality of the bowel  preparation was good. The terminal ileum, ileocecal                            valve, appendiceal orifice, and rectum were                            photographed. Scope In: 8:45:21 AM Scope Out: 9:01:59 AM Scope Withdrawal Time: 0 hours 11 minutes 32 seconds  Total Procedure Duration: 0 hours 16 minutes 38 seconds  Findings:                 The perianal and digital rectal examinations were                            normal.                           Three sessile polyps were found in the transverse                            colon and ascending colon. The polyps were 2 to 3                            mm in size. These polyps were removed with a cold                            snare. Resection and retrieval were complete.                            Estimated blood loss was minimal.                           The exam was otherwise without abnormality on                            direct and retroflexion views except for small,                            internal hemorrhoids. Complications:             No immediate complications. Estimated blood loss:                            Minimal. Estimated Blood Loss:     Estimated blood loss was minimal. Impression:               - Three 2 to 3 mm polyps in the transverse colon                            and in the ascending colon, removed with a cold                            snare. Resected and retrieved.                           - Small internal hemorrhoids.                           -  The examination was otherwise normal on direct                            and retroflexion views.                           - No obvious source for change in bowel habits                            identified on this examination. Recommendation:           - Patient has a contact number available for                            emergencies. The signs and symptoms of potential                            delayed complications were discussed with the                            patient. Return to normal activities tomorrow.                            Written discharge instructions were provided to the                            patient.                           - Resume previous diet today.                           - Continue present medications.                           - Add daily psyllium (such as Metamucil) for stool                            bulking in an effort to minimize your symptoms.                           - Await pathology results.                           - Repeat colonoscopy date to be determined after                            pending pathology results are reviewed for                            surveillance. Thornton Park MD, MD 03/22/2019 9:12:07 AM This report has been signed electronically.

## 2019-03-22 NOTE — Patient Instructions (Signed)
YOU HAD AN ENDOSCOPIC PROCEDURE TODAY AT THE [facility]:   Refer to the procedure report that was given to you for any specific questions about what was found during the examination.  If the procedure report does not answer your questions, please call your gastroenterologist to clarify.  If you requested that your care partner not be given the details of your procedure findings, then the procedure report has been included in a sealed envelope for you to review at your convenience later.  **Handout given on polyps**   YOU SHOULD EXPECT: Some feelings of bloating in the abdomen. Passage of more gas than usual.  Walking can help get rid of the air that was put into your GI tract during the procedure and reduce the bloating. If you had a lower endoscopy (such as a colonoscopy or flexible sigmoidoscopy) you may notice spotting of blood in your stool or on the toilet paper. If you underwent a bowel prep for your procedure, you may not have a normal bowel movement for a few days.  Please Note:  You might notice some irritation and congestion in your nose or some drainage.  This is from the oxygen used during your procedure.  There is no need for concern and it should clear up in a day or so.  SYMPTOMS TO REPORT IMMEDIATELY:   Following lower endoscopy (colonoscopy or flexible sigmoidoscopy):  Excessive amounts of blood in the stool  Significant tenderness or worsening of abdominal pains  Swelling of the abdomen that is new, acute  Fever of 100F or higher   For urgent or emergent issues, a gastroenterologist can be reached at any hour by calling [phone].   DIET:  We do recommend a small meal at first, but then you may proceed to your regular diet.  Drink plenty of fluids but you should avoid alcoholic beverages for 24 hours.  ACTIVITY:  You should plan to take it easy for the rest of today and you should NOT DRIVE or use heavy machinery until tomorrow (because of the sedation  medicines used during the test).    FOLLOW UP: Our staff will call the number listed on your records 48-72 hours following your procedure to check on you and address any questions or concerns that you may have regarding the information given to you following your procedure. If we do not reach you, we will leave a message.  We will attempt to reach you two times.  During this call, we will ask if you have developed any symptoms of COVID 19. If you develop any symptoms (ie: fever, flu-like symptoms, shortness of breath, cough etc.) before then, please call [phone].  If you test positive for Covid 19 in the 2 weeks post procedure, please call and report this information to us.    If any biopsies were taken you will be contacted by phone or by letter within the next 1-3 weeks.  Please call us at [phone] if you have not heard about the biopsies in 3 weeks.    SIGNATURES/CONFIDENTIALITY: You and/or your care partner have signed paperwork which will be entered into your electronic medical record.  These signatures attest to the fact that that the information above on your After Visit Summary has been reviewed and is understood.  Full responsibility of the confidentiality of this discharge information lies with you and/or your care-partner. 

## 2019-03-22 NOTE — Progress Notes (Signed)
Called to room to assist during endoscopic procedure.  Patient ID and intended procedure confirmed with present staff. Received instructions for my participation in the procedure from the performing physician.  

## 2019-03-24 ENCOUNTER — Telehealth: Payer: Self-pay

## 2019-03-24 NOTE — Telephone Encounter (Signed)
  Follow up Call-  Call back number 03/22/2019  Post procedure Call Back phone  # [phone]  Permission to leave phone message Yes  Some recent data might be hidden     Patient questions:  Do you have a fever, pain , or abdominal swelling? No. Pain Score  0 *  Have you tolerated food without any problems? Yes.    Have you been able to return to your normal activities? Yes.    Do you have any questions about your discharge instructions: Diet   No. Medications  No. Follow up visit  No.  Do you have questions or concerns about your Care? No.  Actions: * If pain score is 4 or above: No action needed, pain <4.  1. Have you developed a fever since your procedure? no  2.   Have you had an respiratory symptoms (SOB or cough) since your procedure? no  3.   Have you tested positive for COVID 19 since your procedure no  4.   Have you had any family members/close contacts diagnosed with the COVID 19 since your procedure?  no   If yes to any of these questions please route to Joylene John, RN and Alphonsa Gin, Therapist, sports.

## 2019-03-25 ENCOUNTER — Encounter: Payer: Self-pay | Admitting: Gastroenterology

## 2019-05-16 ENCOUNTER — Other Ambulatory Visit: Payer: Self-pay | Admitting: Family Medicine

## 2019-05-16 DIAGNOSIS — Z1231 Encounter for screening mammogram for malignant neoplasm of breast: Secondary | ICD-10-CM

## 2019-05-18 ENCOUNTER — Other Ambulatory Visit: Payer: Self-pay | Admitting: Internal Medicine

## 2019-05-18 DIAGNOSIS — R002 Palpitations: Secondary | ICD-10-CM

## 2019-05-18 MED ORDER — PROPRANOLOL HCL ER 80 MG PO CP24: 80.0000 mg | ORAL_CAPSULE | Freq: Every day | ORAL | 0 refills | Status: DC

## 2019-05-18 NOTE — Telephone Encounter (Signed)
Rx(s) sent to pharmacy electronically.  

## 2019-05-30 ENCOUNTER — Other Ambulatory Visit: Payer: Self-pay

## 2019-05-30 ENCOUNTER — Ambulatory Visit: Payer: PPO | Admitting: Internal Medicine

## 2019-05-30 ENCOUNTER — Encounter: Payer: Self-pay | Admitting: Internal Medicine

## 2019-05-30 VITALS — BP 118/75 | HR 57 | Ht 67.5 in | Wt 173.0 lb

## 2019-05-30 DIAGNOSIS — R002 Palpitations: Secondary | ICD-10-CM | POA: Diagnosis not present

## 2019-05-30 DIAGNOSIS — E785 Hyperlipidemia, unspecified: Secondary | ICD-10-CM

## 2019-05-30 NOTE — Progress Notes (Signed)
OFFICE NOTE  Chief Complaint:  Annual follow-up  Primary Care Physician: Aletha Halim., PA-C  HPI:  Dawn Underwood is a 69 y.o. female who is currently referred to me for evaluation of palpitations. She reported that she's had palpitations on and off for years however recently had worsening symptoms after a bout of colitis with bright red blood per rectum. This ultimately resolved. She then had problems regulating her thyroid was referred to Dr. Dagmar Hait for further evaluation. She does have a history of graves disease. There is also underlying anxiety and of course palpitations in the past. Family history significant for some coronary disease in her grandparents and she has dyslipidemia and questionable hypertension (she's had both hypotension and hypertension at times, and may have white coat hypertension). In February she was taken the emergency department after an episode where she fell of flushing feeling over her body as well as her heart pounding out of her chest. She became very anxious about this and resented to the emergency department. She said she waited in the ER for several hours and her symptoms resolved prior to being completely evaluated. Ultimately she was told that this was a "panic attack". Since then she's had no other significant episodes but several other smaller episodes of palpitations. She also reports some left shoulder pain which is sharp and may be related to these palpitations. In addition there is some mild shortness of breath.  10/02/15  Dawn Underwood returns today for follow-up of her studies. She underwent an exercise treadmill stress test which was low risk for ischemia. She had an echocardiogram which showed EF 55-60% with normal systolic function, mild diastolic dysfunction trivial MR and mild TR. She did wear a monitor which demonstrated PVCs, bigeminal PVCs and PACs, likely the cause of her palpitations. She reports that they were particularly  bothersome this weekend however she was under significant stress and anxiety related to following around her grandchildren over the holiday.  10/29/2015  Dawn Underwood was seen back today in follow-up. She says her palpitations have nearly resolved on propranolol. She also thinks she's had improvement in her anxiety. She's not had take any additional Xanax since we last saw her month ago.  05/30/2016  Dawn Underwood returns today for follow-up. She reports a recently she's been having some more palpitations. In general are not nearly as bad as before she started propranolol but have become a little worse. She is struggling with some pain in her feet and has a heel spur. She says is difficult for her to walk and has been more anxious because of this. She is using Xanax but sparingly. In general that seems to help with her palpitations as well.  06/01/2017  Dawn Underwood was seen today in annual follow-up.  She reports her palpitations have been very well controlled.  Blood pressure is at goal.  She exercises 3-5 days a week without any limitations.  She reports 2 episodes over the past year of feeling of hot and flushed with some associated vomiting or diarrhea.  She is also had some morning vomiting after taking her medications and drinking a cup of coffee.  She typically does not have any food on her stomach that time.  It was thought that these might be hot flashes however she does not feel like there typically associated with that.  To me these episodes sound like reflux.  We talked about ways to try to reduce reflux and that she could consider over-the-counter H2  blockers or PPIs.  05/26/2018  Dawn Underwood is seen today in annual follow-up.  She is done well with regards her palpitations over the past year.  She continues to exercise at least 3 times a week.  She was exercising more but developed hip problems on the elliptical machine and her orthopedist told her to back off.  She did have some lab  work recently which showed total cholesterol of 157, triglycerides 95, HDL 58 and LDL 82.  She is on low-dose simvastatin.  She takes propranolol primarily for palpitations and has not had an issue with blood pressure.  05/30/2019  Dawn Underwood is seen today in follow-up.  She has very infrequent palpitations.  She continues to walk outside.  Unfortunate she is not been able to go to the gym due to Covid.  She denies any chest pain.  Overall she is pleased with her palpitation control on propranolol.  PMHx:  Past Medical History:  Diagnosis Date  . Acute sinusitis, unspecified   . Anemia   . Cystocele or rectocele with complete uterine prolapse   . Graves' disease   . Hyperlipidemia   . Joint pain   . Light headedness   . Lower back pain   . Other malaise and fatigue     Past Surgical History:  Procedure Laterality Date  . ABDOMINAL HYSTERECTOMY    . bladder mesh    . BREAST BIOPSY Left   . CATARACT EXTRACTION     Bil  . TUBAL LIGATION      FAMHx:  Family History  Problem Relation Age of Onset  . Diabetes Father   . Colon cancer Neg Hx   . Esophageal cancer Neg Hx   . Rectal cancer Neg Hx   . Stomach cancer Neg Hx     SOCHx:   reports that she quit smoking about 47 years ago. She has never used smokeless tobacco. She reports current alcohol use of about 4.0 standard drinks of alcohol per week. She reports that she does not use drugs.  ALLERGIES:  Allergies  Allergen Reactions  . Erythromycin Other (See Comments)    Pt doesn't remember    ROS: Pertinent items noted in HPI and remainder of comprehensive ROS otherwise negative.  HOME MEDS: Current Outpatient Medications  Medication Sig Dispense Refill  . aspirin EC 81 MG tablet Take 81 mg by mouth daily.    . calcium citrate-vitamin D (CITRACAL+D) 315-200 MG-UNIT tablet Take 1 tablet by mouth 2 (two) times daily. Take 1 tab by mouth in the morning and tabs in the evening    . Cholecalciferol (VITAMIN D3) 50 MCG  (2000 UT) TABS Take 1 tablet by mouth daily.    . Coenzyme Q10 (CO Q 10) 100 MG CAPS Take 1 capsule by mouth daily.    . Cyanocobalamin (B-12) 2500 MCG SUBL Place 1 each under the tongue daily.    . Glucosamine-Chondroit-Vit C-Mn (GLUCOSAMINE 1500 COMPLEX PO) Take 1 tablet by mouth 2 (two) times daily.    . Omega 3-6-9 Fatty Acids (OMEGA 3-6-9 COMPLEX PO) Take by mouth.    . propranolol ER (INDERAL LA) 80 MG 24 hr capsule Take 1 capsule (80 mg total) by mouth daily. 90 capsule 0  . simvastatin (ZOCOR) 10 MG tablet Take 10 mg by mouth at bedtime.     No current facility-administered medications for this visit.    LABS/IMAGING: No results found for this or any previous visit (from the past 48 hour(s)). No results found.  WEIGHTS: Wt Readings from Last 3 Encounters:  05/30/19 173 lb (78.5 kg)  03/22/19 171 lb (77.6 kg)  02/08/19 171 lb (77.6 kg)    VITALS: BP 118/75   Pulse (!) 57   Ht 5' 7.5" (1.715 m)   Wt 173 lb (78.5 kg)   LMP  (LMP Unknown)   SpO2 99%   BMI 26.70 kg/m   EXAM: General appearance: alert and no distress Neck: no carotid bruit, no JVD and thyroid not enlarged, symmetric, no tenderness/mass/nodules Lungs: clear to auscultation bilaterally Heart: regular rate and rhythm, S1, S2 normal, no murmur, click, rub or gallop Abdomen: soft, non-tender; bowel sounds normal; no masses,  no organomegaly Extremities: extremities normal, atraumatic, no cyanosis or edema Pulses: 2+ and symmetric Skin: Skin color, texture, turgor normal. No rashes or lesions Neurologic: Grossly normal Psych: Pleasant  EKG: Sinus bradycardia 57-personally reviewed  ASSESSMENT: 1. Symptomatic palpitations - PAC's, PVC's and ventricular bigeminy 2. 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   Mrs. Zagal has had good control of her palpitations on propranolol.  No evidence of significant recurrence.  I have continued encourage her walking and exercise.  No changes  to her medicines today.  Follow-up annually or sooner as necessary.  Pixie Casino, MD, [facility], [place] Director of the Advanced Lipid [facility]  Cardiovascular Risk Reduction Clinic Diplomate of the American Board of Clinical Lipidology Attending Cardiologist  Direct Dial: [phone]  Fax: [phone]  Website:  [email or web address] 05/30/2019, 11:13 AM

## 2019-05-30 NOTE — Patient Instructions (Signed)
Medication Instructions:  Your physician recommends that you continue on your current medications as directed. Please refer to the Current Medication list given to you today.  *If you need a refill on your cardiac medications before your next appointment, please call your pharmacy*   Follow-Up: At [facility], you and your health needs are our priority.  As part of our continuing mission to provide you with exceptional heart care, we have created designated Provider Care Teams.  These Care Teams include your primary Cardiologist (physician) and Advanced Practice Providers (APPs -  Physician Assistants and Nurse Practitioners) who all work together to provide you with the care you need, when you need it.  Your next appointment:   12 months  The format for your next appointment:   In Person  Provider:   You may see Dr. Hilty or one of the following Advanced Practice Providers on your designated Care Team:    Hao Meng, PA-C  Angela Duke, PA-C or   Krista Kroeger, PA-C   Other Instructions   

## 2019-06-23 ENCOUNTER — Ambulatory Visit: Payer: PPO

## 2019-07-15 ENCOUNTER — Other Ambulatory Visit: Payer: Self-pay

## 2019-07-15 DIAGNOSIS — R002 Palpitations: Secondary | ICD-10-CM

## 2019-07-15 MED ORDER — PROPRANOLOL HCL ER 80 MG PO CP24: 80.0000 mg | ORAL_CAPSULE | Freq: Every day | ORAL | 0 refills | Status: DC

## 2019-07-27 ENCOUNTER — Other Ambulatory Visit: Payer: Self-pay

## 2019-07-27 ENCOUNTER — Ambulatory Visit
Admission: RE | Admit: 2019-07-27 | Discharge: 2019-07-27 | Disposition: A | Discharge status: Home / Self Care | Payer: PPO | Source: Ambulatory Visit | Attending: Family Medicine | Admitting: Family Medicine

## 2019-07-27 DIAGNOSIS — Z1231 Encounter for screening mammogram for malignant neoplasm of breast: Secondary | ICD-10-CM

## 2019-09-20 ENCOUNTER — Other Ambulatory Visit: Payer: Self-pay

## 2019-09-20 DIAGNOSIS — R002 Palpitations: Secondary | ICD-10-CM

## 2019-09-20 MED ORDER — PROPRANOLOL HCL ER 80 MG PO CP24: 80.0000 mg | ORAL_CAPSULE | Freq: Every day | ORAL | 2 refills | Status: DC

## 2019-09-27 DIAGNOSIS — F439 Reaction to severe stress, unspecified: Secondary | ICD-10-CM | POA: Diagnosis not present

## 2019-09-27 DIAGNOSIS — F5104 Psychophysiologic insomnia: Secondary | ICD-10-CM | POA: Diagnosis not present

## 2019-09-27 DIAGNOSIS — F411 Generalized anxiety disorder: Secondary | ICD-10-CM | POA: Diagnosis not present

## 2019-10-06 DIAGNOSIS — I10 Essential (primary) hypertension: Secondary | ICD-10-CM | POA: Diagnosis not present

## 2019-10-06 DIAGNOSIS — E7800 Pure hypercholesterolemia, unspecified: Secondary | ICD-10-CM | POA: Diagnosis not present

## 2019-10-11 DIAGNOSIS — M8580 Other specified disorders of bone density and structure, unspecified site: Secondary | ICD-10-CM | POA: Diagnosis not present

## 2019-10-11 DIAGNOSIS — Z0000 Encounter for general adult medical examination without abnormal findings: Secondary | ICD-10-CM | POA: Diagnosis not present

## 2019-10-11 DIAGNOSIS — E785 Hyperlipidemia, unspecified: Secondary | ICD-10-CM | POA: Diagnosis not present

## 2019-10-11 DIAGNOSIS — R002 Palpitations: Secondary | ICD-10-CM | POA: Diagnosis not present

## 2019-10-11 DIAGNOSIS — F411 Generalized anxiety disorder: Secondary | ICD-10-CM | POA: Diagnosis not present

## 2019-10-11 DIAGNOSIS — G4700 Insomnia, unspecified: Secondary | ICD-10-CM | POA: Diagnosis not present

## 2020-01-17 DIAGNOSIS — Z23 Encounter for immunization: Secondary | ICD-10-CM | POA: Diagnosis not present

## 2020-04-09 ENCOUNTER — Other Ambulatory Visit: Payer: Self-pay | Admitting: Internal Medicine

## 2020-04-09 DIAGNOSIS — R002 Palpitations: Secondary | ICD-10-CM

## 2020-04-09 MED ORDER — PROPRANOLOL HCL ER 80 MG PO CP24: 80.0000 mg | ORAL_CAPSULE | Freq: Every day | ORAL | 0 refills | Status: DC

## 2020-06-04 ENCOUNTER — Ambulatory Visit: Payer: Medicare Other | Admitting: Internal Medicine

## 2020-06-04 ENCOUNTER — Other Ambulatory Visit: Payer: Self-pay | Admitting: *Deleted

## 2020-06-04 ENCOUNTER — Ambulatory Visit: Payer: PPO | Admitting: Internal Medicine

## 2020-06-04 ENCOUNTER — Encounter: Payer: Self-pay | Admitting: Internal Medicine

## 2020-06-04 ENCOUNTER — Ambulatory Visit (INDEPENDENT_AMBULATORY_CARE_PROVIDER_SITE_OTHER): Payer: Medicare Other

## 2020-06-04 ENCOUNTER — Encounter: Payer: Self-pay | Admitting: *Deleted

## 2020-06-04 ENCOUNTER — Other Ambulatory Visit: Payer: Self-pay

## 2020-06-04 VITALS — BP 135/84 | HR 67 | Ht 67.5 in | Wt 175.0 lb

## 2020-06-04 DIAGNOSIS — E785 Hyperlipidemia, unspecified: Secondary | ICD-10-CM

## 2020-06-04 DIAGNOSIS — R002 Palpitations: Secondary | ICD-10-CM

## 2020-06-04 DIAGNOSIS — R0789 Other chest pain: Secondary | ICD-10-CM | POA: Diagnosis not present

## 2020-06-04 NOTE — Progress Notes (Signed)
OFFICE NOTE  Chief Complaint:  Chest wall stiffening  Primary Care Physician: Richmond Campbell., PA-C  HPI:  Dawn Underwood is a 70 y.o. female who is currently referred to me for evaluation of palpitations. She reported that she's had palpitations on and off for years however recently had worsening symptoms after a bout of colitis with bright red blood per rectum. This ultimately resolved. She then had problems regulating her thyroid was referred to Dr. Debara Pickett for further evaluation. She does have a history of graves disease. There is also underlying anxiety and of course palpitations in the past. Family history significant for some coronary disease in her grandparents and she has dyslipidemia and questionable hypertension (she's had both hypotension and hypertension at times, and may have white coat hypertension). In February she was taken the emergency department after an episode where she fell of flushing feeling over her body as well as her heart pounding out of her chest. She became very anxious about this and resented to the emergency department. She said she waited in the ER for several hours and her symptoms resolved prior to being completely evaluated. Ultimately she was told that this was a "panic attack". Since then she's had no other significant episodes but several other smaller episodes of palpitations. She also reports some left shoulder pain which is sharp and may be related to these palpitations. In addition there is some mild shortness of breath.  10/02/15  Dawn Underwood returns today for follow-up of her studies. She underwent an exercise treadmill stress test which was low risk for ischemia. She had an echocardiogram which showed EF 55-60% with normal systolic function, mild diastolic dysfunction trivial MR and mild TR. She did wear a monitor which demonstrated PVCs, bigeminal PVCs and PACs, likely the cause of her palpitations. She reports that they were particularly  bothersome this weekend however she was under significant stress and anxiety related to following around her grandchildren over the holiday.  10/29/2015  Dawn Underwood was seen back today in follow-up. She says her palpitations have nearly resolved on propranolol. She also thinks she's had improvement in her anxiety. She's not had take any additional Xanax since we last saw her month ago.  05/30/2016  Dawn Underwood returns today for follow-up. She reports a recently she's been having some more palpitations. In general are not nearly as bad as before she started propranolol but have become a little worse. She is struggling with some pain in her feet and has a heel spur. She says is difficult for her to walk and has been more anxious because of this. She is using Xanax but sparingly. In general that seems to help with her palpitations as well.  06/01/2017  Dawn Underwood was seen today in annual follow-up.  She reports her palpitations have been very well controlled.  Blood pressure is at goal.  She exercises 3-5 days a week without any limitations.  She reports 2 episodes over the past year of feeling of hot and flushed with some associated vomiting or diarrhea.  She is also had some morning vomiting after taking her medications and drinking a cup of coffee.  She typically does not have any food on her stomach that time.  It was thought that these might be hot flashes however she does not feel like there typically associated with that.  To me these episodes sound like reflux.  We talked about ways to try to reduce reflux and that she could consider over-the-counter  H2 blockers or PPIs.  05/26/2018  Dawn Underwood is seen today in annual follow-up.  She is done well with regards her palpitations over the past year.  She continues to exercise at least 3 times a week.  She was exercising more but developed hip problems on the elliptical machine and her orthopedist told her to back off.  She did have some lab  work recently which showed total cholesterol of 157, triglycerides 95, HDL 58 and LDL 82.  She is on low-dose simvastatin.  She takes propranolol primarily for palpitations and has not had an issue with blood pressure.  05/30/2019  Dawn Underwood is seen today in follow-up.  She has very infrequent palpitations.  She continues to walk outside.  Unfortunate she is not been able to go to the gym due to Covid.  She denies any chest pain.  Overall she is pleased with her palpitation control on propranolol.  06/04/2020  Dawn Underwood returns today for follow-up.  More recently she has had episodes of tightness across her chest.  She describes it more as stiffness of her muscles.  She says it comes on and does not feel like squeezing, pressure or heaviness, she denies any tachycardia or palpitations but then when the episode lets off she says she feels very fatigued.  She says she has had about 15 of these episodes in the past few weeks including 2 episodes yesterday.  Initially she thought it was due to sertraline which she was started on for depression/anxiety by her PCP however that was discontinued and her symptoms have persisted.  There is some mild shortness of breath particular walking upstairs but it does not cause her to need to stop.  PMHx:  Past Medical History:  Diagnosis Date  . Acute sinusitis, unspecified   . Anemia   . Cystocele or rectocele with complete uterine prolapse   . Graves' disease   . Hyperlipidemia   . Joint pain   . Light headedness   . Lower back pain   . Other malaise and fatigue     Past Surgical History:  Procedure Laterality Date  . ABDOMINAL HYSTERECTOMY    . bladder mesh    . BREAST BIOPSY Left   . CATARACT EXTRACTION     Bil  . TUBAL LIGATION      FAMHx:  Family History  Problem Relation Age of Onset  . Diabetes Father   . Colon cancer Neg Hx   . Esophageal cancer Neg Hx   . Rectal cancer Neg Hx   . Stomach cancer Neg Hx     SOCHx:   reports  that she quit smoking about 48 years ago. She has never used smokeless tobacco. She reports current alcohol use of about 4.0 standard drinks of alcohol per week. She reports that she does not use drugs.  ALLERGIES:  Allergies  Allergen Reactions  . Erythromycin Other (See Comments)    Pt doesn't remember    ROS: Pertinent items noted in HPI and remainder of comprehensive ROS otherwise negative.  HOME MEDS: Current Outpatient Medications  Medication Sig Dispense Refill  . aspirin EC 81 MG tablet Take 81 mg by mouth daily.    . calcium citrate-vitamin D (CITRACAL+D) 315-200 MG-UNIT tablet Take 1 tablet by mouth 2 (two) times daily. Take 1 tab by mouth in the morning and tabs in the evening    . Cholecalciferol (VITAMIN D3) 50 MCG (2000 UT) TABS Take 1 tablet by mouth daily.    Marland Kitchen  Coenzyme Q10 (CO Q-10) 300 MG CAPS Take by mouth.    . Cyanocobalamin (B-12) 2500 MCG SUBL Place 1 each under the tongue daily.    . Glucosamine-Chondroit-Vit C-Mn (GLUCOSAMINE 1500 COMPLEX PO) Take 1 tablet by mouth 2 (two) times daily.    . Omega 3-6-9 Fatty Acids (OMEGA 3-6-9 COMPLEX PO) Take by mouth.    . propranolol ER (INDERAL LA) 80 MG 24 hr capsule Take 1 capsule (80 mg total) by mouth daily. 90 capsule 0  . simvastatin (ZOCOR) 10 MG tablet Take 10 mg by mouth at bedtime.     No current facility-administered medications for this visit.    LABS/IMAGING: No results found for this or any previous visit (from the past 48 hour(s)). No results found.  WEIGHTS: Wt Readings from Last 3 Encounters:  06/04/20 175 lb (79.4 kg)  05/30/19 173 lb (78.5 kg)  03/22/19 171 lb (77.6 kg)    VITALS: BP 135/84 (BP Location: Left Arm, Patient Position: Sitting)   Pulse 67   Ht 5' 7.5" (1.715 m)   Wt 175 lb (79.4 kg)   LMP  (LMP Unknown)   SpO2 98%   BMI 27.00 kg/m   EXAM: General appearance: alert and no distress Neck: no carotid bruit, no JVD and thyroid not enlarged, symmetric, no  tenderness/mass/nodules Lungs: clear to auscultation bilaterally Heart: regular rate and rhythm, S1, S2 normal, no murmur, click, rub or gallop Abdomen: soft, non-tender; bowel sounds normal; no masses,  no organomegaly Extremities: extremities normal, atraumatic, no cyanosis or edema Pulses: 2+ and symmetric Skin: Skin color, texture, turgor normal. No rashes or lesions Neurologic: Grossly normal Psych: Pleasant  EKG: Normal sinus rhythm 67-personally reviewed  ASSESSMENT: 1. "Chest wall stiffening followed by fatigue" 2. Symptomatic palpitations - PAC's, PVC's and ventricular bigeminy 3. 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   Mrs. Korzeniewski describes chest wall "stiffening" then followed by significant fatigue.  She denies any palpitations although has been symptomatic before showing PACs PVCs and ventricular bigeminy.  I wonder if this could be atrial fibrillation, oftentimes episodes could be interpreted as chest discomfort or pain and then are typically followed after resolution with fatigue due to loss of atrial kick.  She has had numerous episodes recently.  I recommend a 2-week Zio patch monitor to see if we can identify these episodes.  Plan follow-up afterwards.  If this is unrevealing, we may need to consider ischemia evaluation.  My pretest probability for this however is low.  Pixie Casino, MD, [facility], [place] Director of the Advanced Lipid Disorders &  Cardiovascular Risk Reduction Clinic Diplomate of the American Board of Clinical Lipidology Attending Cardiologist  Direct Dial: [phone]  Fax: [phone]  Website:  [email or web address] 06/04/2020, 11:44 AM

## 2020-06-04 NOTE — Patient Instructions (Signed)
Medication Instructions:  Your physician recommends that you continue on your current medications as directed. Please refer to the Current Medication list given to you today.  *If you need a refill on your cardiac medications before your next appointment, please call your pharmacy*  Testing/Procedures: [place] Monitor Instructions   Your physician has requested you wear your ZIO patch monitor 14 days.   This is a single patch monitor.  [employer] supplies one patch monitor per enrollment.  Additional stickers are not available.   Please do not apply patch if you will be having a Nuclear Stress Test, Echocardiogram, Cardiac CT, MRI, or Chest Xray during the time frame you would be wearing the monitor. The patch cannot be worn during these tests.  You cannot remove and re-apply the ZIO XT patch monitor.   Your ZIO patch monitor will be sent USPS Priority mail from [facility] directly to your home address. The monitor may also be mailed to a PO BOX if home delivery is not available.   It may take 3-5 days to receive your monitor after you have been enrolled.   Once you have received you monitor, please review enclosed instructions.  Your monitor has already been registered assigning a specific monitor serial # to you.   Applying the monitor   Shave hair from upper left chest.   Hold abrader disc by orange tab.  Rub abrader in 40 strokes over left upper chest as indicated in your monitor instructions.   Clean area with 4 enclosed alcohol pads .  Use all pads to assure are is cleaned thoroughly.  Let dry.   Apply patch as indicated in monitor instructions.  Patch will be place under collarbone on left side of chest with arrow pointing upward.   Rub patch adhesive wings for 2 minutes.Remove white label marked "1".  Remove white label marked "2".  Rub patch adhesive wings for 2 additional minutes.   While looking in a mirror, press and release button in center of patch.  A  small green light will flash 3-4 times .  This will be your only indicator the monitor has been turned on.     Do not shower for the first 24 hours.  You may shower after the first 24 hours.   Press button if you feel a symptom. You will hear a small click.  Record Date, Time and Symptom in the Patient Log Book.   When you are ready to remove patch, follow instructions on last 2 pages of Patient Log Book.  Stick patch monitor onto last page of Patient Log Book.   Place Patient Log Book in [place] box.  Use locking tab on box and tape box closed securely.  The [employer] has [employer] on it.  Please place in mailbox as soon as possible.  Your physician should have your test results approximately 7 days after the monitor has been mailed back to [facility].   Call [facility] at [phone] if you have questions regarding your ZIO XT patch monitor.  Call them immediately if you see an orange light blinking on your monitor.   If your monitor falls off in less than 4 days contact our Monitor department at [phone].  If your monitor becomes loose or falls off after 4 days call [employer] at [phone] for suggestions on securing your monitor.     Follow-Up: At [facility], you and your health needs are our priority.  As part  of our continuing mission to provide you with exceptional heart care, we have created designated Provider Care Teams.  These Care Teams include your primary Cardiologist (physician) and Advanced Practice Providers (APPs -  Physician Assistants and Nurse Practitioners) who all work together to provide you with the care you need, when you need it.  We recommend signing up for the patient portal called "MyChart".  Sign up information is provided on this After Visit Summary.  MyChart is used to connect with patients for Virtual Visits (Telemedicine).  Patients are able to view lab/test results, encounter notes, upcoming appointments, etc.   Non-urgent messages can be sent to your provider as well.   To learn more about what you can do with MyChart, go to [email or web address].    Your next appointment:   6 weeks - after zio monitor  The format for your next appointment:   In Person  Provider:   You may see Pixie Casino, MD or one of the following Advanced Practice Providers on your designated Care Team:    Almyra Deforest, PA-C  Fabian Sharp, PA-C or   Roby Lofts, [place]    Other Instructions

## 2020-06-04 NOTE — Progress Notes (Signed)
Patient ID: Dawn Underwood, female   DOB: 1951/03/31, 70 y.o.   MRN: [id number] Patient enrolled for [employer] to ship a 14 day ZIO XT long term holter monitor to her home.

## 2020-06-09 DIAGNOSIS — R002 Palpitations: Secondary | ICD-10-CM | POA: Diagnosis not present

## 2020-06-15 ENCOUNTER — Other Ambulatory Visit: Payer: Self-pay | Admitting: Family Medicine

## 2020-06-15 DIAGNOSIS — Z1231 Encounter for screening mammogram for malignant neoplasm of breast: Secondary | ICD-10-CM

## 2020-06-18 ENCOUNTER — Other Ambulatory Visit: Payer: Self-pay

## 2020-06-18 DIAGNOSIS — R002 Palpitations: Secondary | ICD-10-CM

## 2020-06-18 MED ORDER — PROPRANOLOL HCL ER 80 MG PO CP24: 80.0000 mg | ORAL_CAPSULE | Freq: Every day | ORAL | 3 refills | Status: DC

## 2020-06-19 ENCOUNTER — Telehealth: Payer: Self-pay | Admitting: Internal Medicine

## 2020-06-19 NOTE — Telephone Encounter (Signed)
Patient is requesting to have her prescription transferred to the pharmacy listed below.   *STAT* If patient is at the pharmacy, call can be transferred to refill team.   1. Which medications need to be refilled? (please list name of each medication and dose if known) propranolol ER (INDERAL LA) 80 MG 24 hr capsule  2. Which pharmacy/location (including street and city if local pharmacy) is medication to be sent to? [facility] (phone #: [phone])  3. Do they need a 30 day or 90 day supply? 90 day supply

## 2020-07-05 ENCOUNTER — Other Ambulatory Visit: Payer: Self-pay | Admitting: *Deleted

## 2020-07-05 DIAGNOSIS — R002 Palpitations: Secondary | ICD-10-CM

## 2020-07-05 MED ORDER — PROPRANOLOL HCL ER 120 MG PO CP24: 120.0000 mg | ORAL_CAPSULE | Freq: Every day | ORAL | 1 refills | Status: DC

## 2020-07-06 IMAGING — MG DIGITAL SCREENING BILATERAL MAMMOGRAM WITH TOMO AND CAD
8 series · 8 of 24 positions shown · non-contrast
Comparison: Previous exam(s).

CLINICAL DATA: Screening.

EXAM:
DIGITAL SCREENING BILATERAL MAMMOGRAM WITH TOMO AND CAD

[R CC synth-2D]
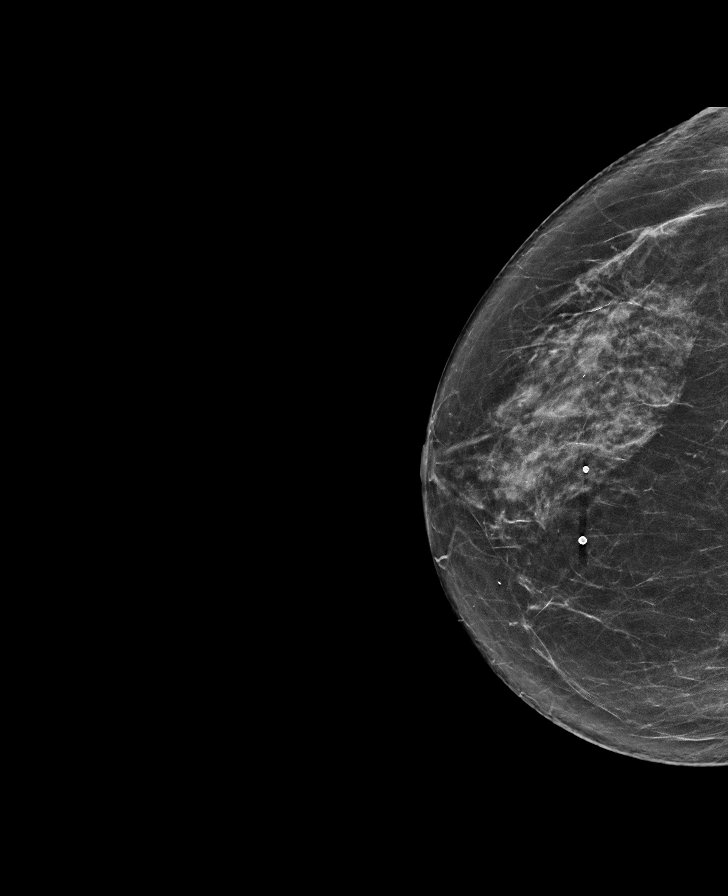

[L CC synth-2D]
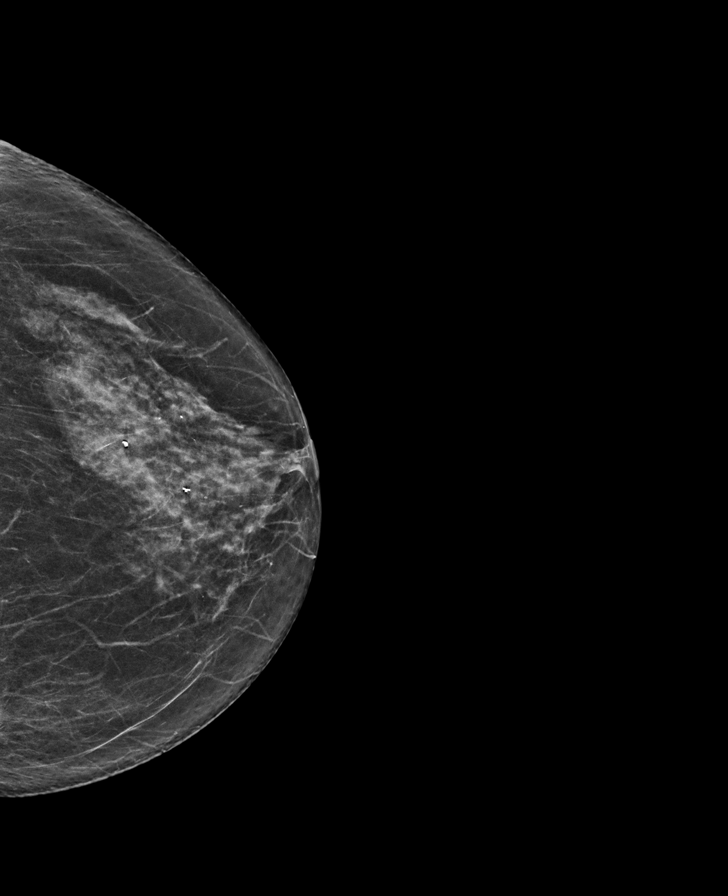

[L MLO synth-2D]
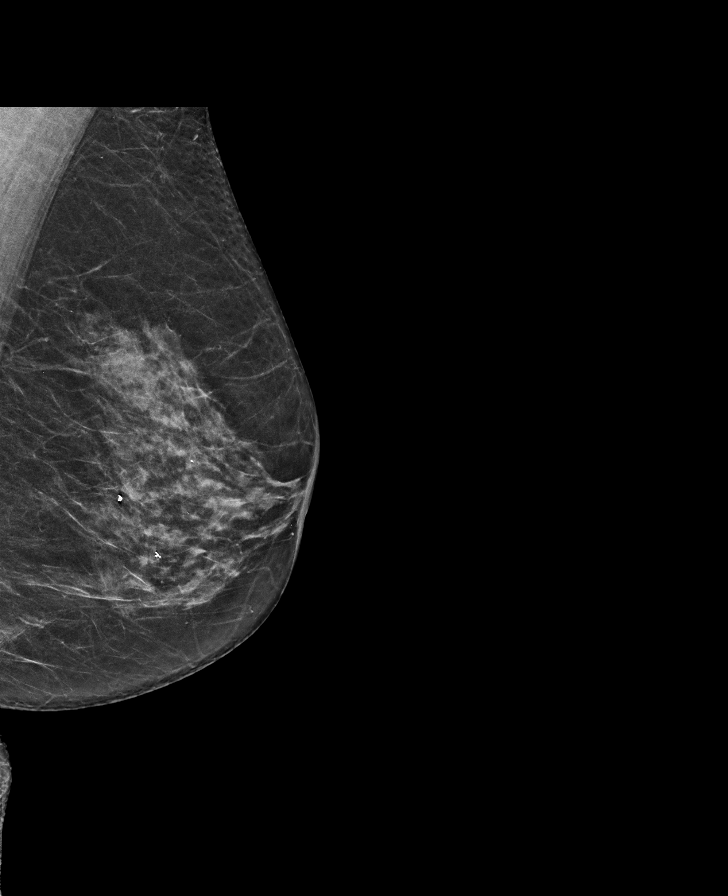

[R MLO synth-2D]
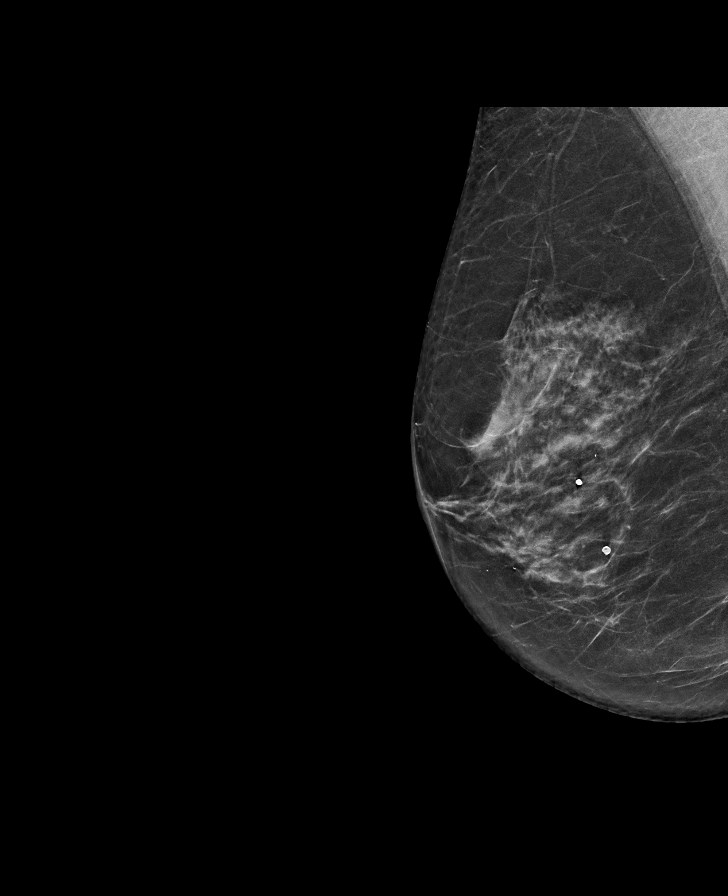

[R CC tomo · tomo slice 37/72.0]
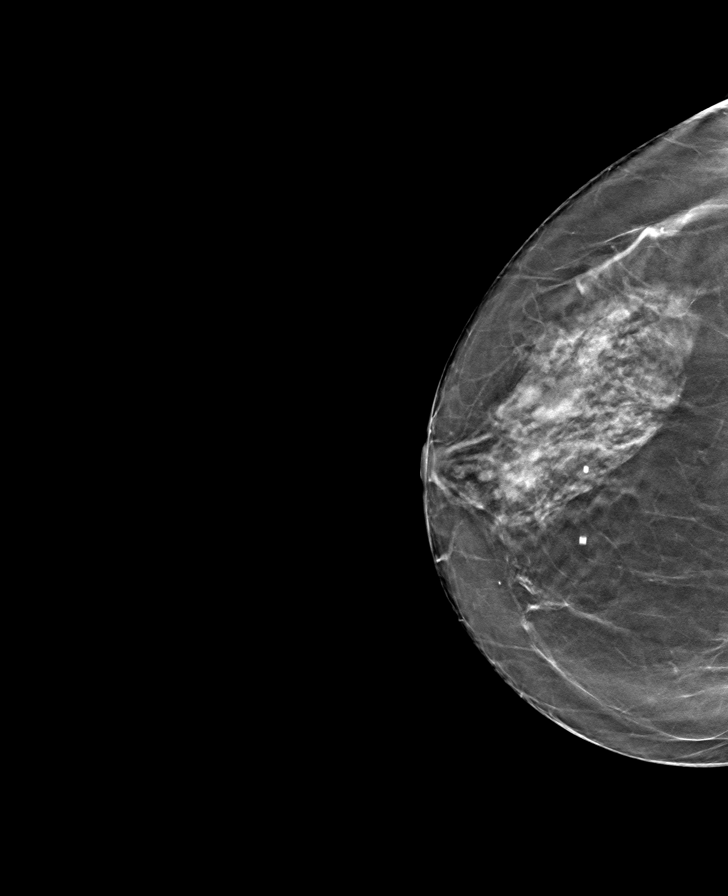

[L CC tomo · tomo slice 32/63.0]
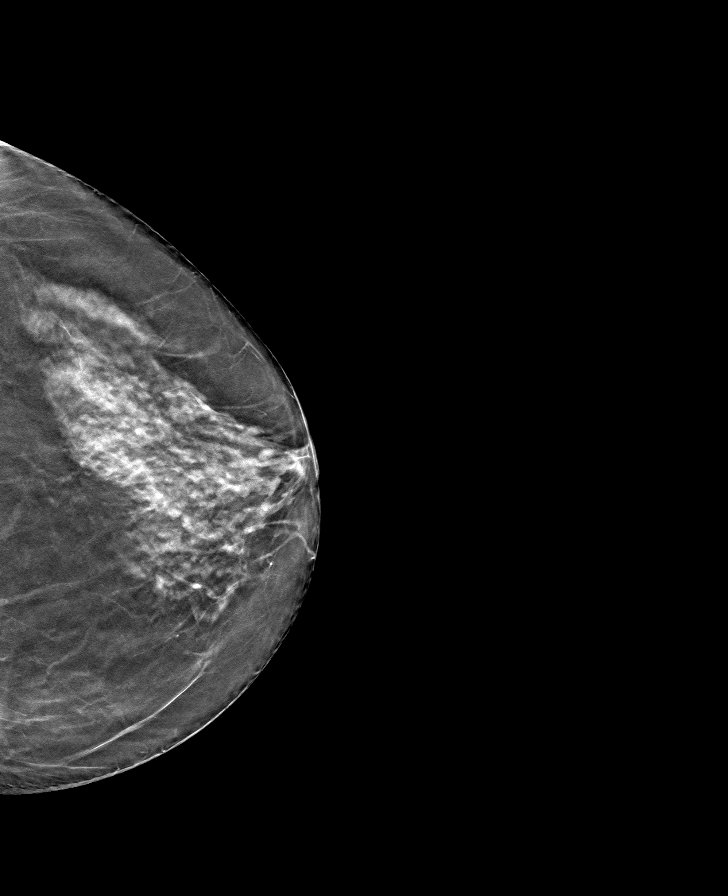

[R MLO tomo · tomo slice 36/71.0]
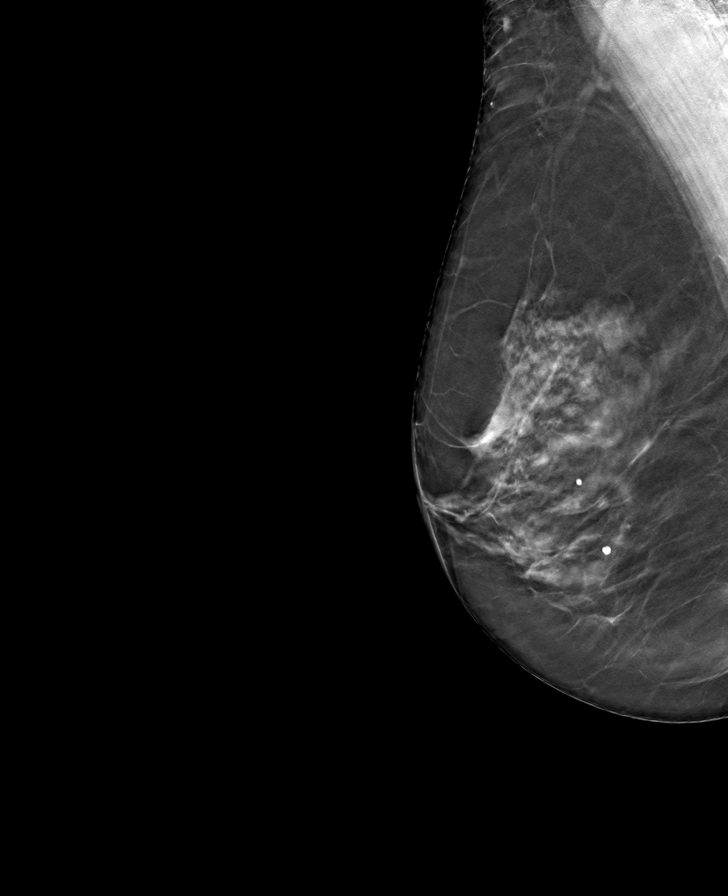

[L MLO tomo · tomo slice 34/67.0]
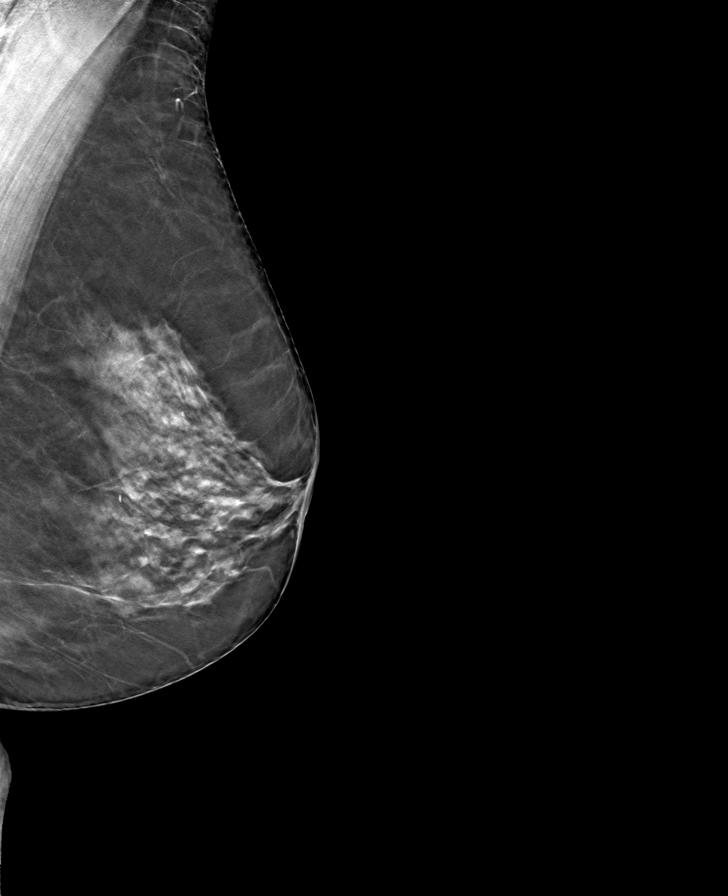

[8 of 24 positions shown; findings below may reference images not displayed]

ACR Breast Density Category c: The breast tissue is heterogeneously
dense, which may obscure small masses.
FINDINGS: There are no findings suspicious for malignancy. Images were
processed with CAD.
IMPRESSION: No mammographic evidence of malignancy. A result letter of this
screening mammogram will be mailed directly to the patient.

RECOMMENDATION:
Screening mammogram in one year. (Code:[id number])

BI-RADS CATEGORY  1: Negative.

## 2020-07-30 ENCOUNTER — Encounter: Payer: Self-pay | Admitting: Internal Medicine

## 2020-07-30 ENCOUNTER — Other Ambulatory Visit: Payer: Self-pay

## 2020-07-30 ENCOUNTER — Ambulatory Visit: Payer: Medicare Other | Admitting: Internal Medicine

## 2020-07-30 VITALS — BP 136/78 | HR 62 | Ht 67.5 in | Wt 171.2 lb

## 2020-07-30 DIAGNOSIS — I4729 Other ventricular tachycardia: Secondary | ICD-10-CM

## 2020-07-30 DIAGNOSIS — I472 Ventricular tachycardia: Secondary | ICD-10-CM | POA: Diagnosis not present

## 2020-07-30 DIAGNOSIS — R079 Chest pain, unspecified: Secondary | ICD-10-CM

## 2020-07-30 NOTE — Patient Instructions (Signed)
Medication Instructions:  Your physician recommends that you continue on your current medications as directed. Please refer to the Current Medication list given to you today.  *If you need a refill on your cardiac medications before your next appointment, please call your pharmacy*  Testing/Procedures: Dr. Debara Pickett has ordered a Lexiscan Myocardial Perfusion Imaging Study. This will be done at [address]  Please arrive 15 minutes prior to your appointment time for registration and insurance purposes.   The test will take approximately 3 to 4 hours to complete; you may bring reading material.  If someone comes with you to your appointment, they will need to remain in the main lobby due to limited space in the testing area. **If you are pregnant or breastfeeding, please notify the nuclear lab prior to your appointment**   How to prepare for your Myocardial Perfusion Test:  Do not eat or drink 3 hours prior to your test, except you may have water.  Do not consume products containing caffeine (regular or decaffeinated) 12 hours prior to your test. (ex: coffee, chocolate, sodas, tea).  Do wear comfortable clothes (no dresses or overalls) and walking shoes, tennis shoes preferred (No heels or open toe shoes are allowed).  Do NOT wear cologne, perfume, aftershave, or lotions (deodorant is allowed).  If you use an inhaler, use it the AM of your test and bring it with you.   If you use a nebulizer, use it the AM of your test.   If these instructions are not followed, your test will have to be rescheduled.    Follow-Up: At [facility], you and your health needs are our priority.  As part of our continuing mission to provide you with exceptional heart care, we have created designated Provider Care Teams.  These Care Teams include your primary Cardiologist (physician) and Advanced Practice Providers (APPs -  Physician Assistants and Nurse Practitioners) who all work  together to provide you with the care you need, when you need it.  We recommend signing up for the patient portal called "MyChart".  Sign up information is provided on this After Visit Summary.  MyChart is used to connect with patients for Virtual Visits (Telemedicine).  Patients are able to view lab/test results, encounter notes, upcoming appointments, etc.  Non-urgent messages can be sent to your provider as well.   To learn more about what you can do with MyChart, go to [email or web address].    Your next appointment:   2-3 month(s)  The format for your next appointment:   In Person  Provider:   You may see Pixie Casino, MD or one of the following Advanced Practice Providers on your designated Care Team:    Almyra Deforest, PA-C  Fabian Sharp, PA-C or   Roby Lofts, [place]    Other Instructions

## 2020-07-30 NOTE — Progress Notes (Signed)
OFFICE NOTE  Chief Complaint:  Follow-up chest wall stiffening  Primary Care Physician: Aletha Halim., PA-C  HPI:  Dawn Underwood is a 70 y.o. female who is currently referred to me for evaluation of palpitations. She reported that she's had palpitations on and off for years however recently had worsening symptoms after a bout of colitis with bright red blood per rectum. This ultimately resolved. She then had problems regulating her thyroid was referred to Dr. Dagmar Hait for further evaluation. She does have a history of graves disease. There is also underlying anxiety and of course palpitations in the past. Family history significant for some coronary disease in her grandparents and she has dyslipidemia and questionable hypertension (she's had both hypotension and hypertension at times, and may have white coat hypertension). In February she was taken the emergency department after an episode where she fell of flushing feeling over her body as well as her heart pounding out of her chest. She became very anxious about this and resented to the emergency department. She said she waited in the ER for several hours and her symptoms resolved prior to being completely evaluated. Ultimately she was told that this was a "panic attack". Since then she's had no other significant episodes but several other smaller episodes of palpitations. She also reports some left shoulder pain which is sharp and may be related to these palpitations. In addition there is some mild shortness of breath.  10/02/15  Dawn Underwood returns today for follow-up of her studies. She underwent an exercise treadmill stress test which was low risk for ischemia. She had an echocardiogram which showed EF 55-60% with normal systolic function, mild diastolic dysfunction trivial MR and mild TR. She did wear a monitor which demonstrated PVCs, bigeminal PVCs and PACs, likely the cause of her palpitations. She reports that they were  particularly bothersome this weekend however she was under significant stress and anxiety related to following around her grandchildren over the holiday.  10/29/2015  Dawn Underwood was seen back today in follow-up. She says her palpitations have nearly resolved on propranolol. She also thinks she's had improvement in her anxiety. She's not had take any additional Xanax since we last saw her month ago.  05/30/2016  Dawn Underwood returns today for follow-up. She reports a recently she's been having some more palpitations. In general are not nearly as bad as before she started propranolol but have become a little worse. She is struggling with some pain in her feet and has a heel spur. She says is difficult for her to walk and has been more anxious because of this. She is using Xanax but sparingly. In general that seems to help with her palpitations as well.  06/01/2017  Dawn Underwood was seen today in annual follow-up.  She reports her palpitations have been very well controlled.  Blood pressure is at goal.  She exercises 3-5 days a week without any limitations.  She reports 2 episodes over the past year of feeling of hot and flushed with some associated vomiting or diarrhea.  She is also had some morning vomiting after taking her medications and drinking a cup of coffee.  She typically does not have any food on her stomach that time.  It was thought that these might be hot flashes however she does not feel like there typically associated with that.  To me these episodes sound like reflux.  We talked about ways to try to reduce reflux and that she could consider  over-the-counter H2 blockers or PPIs.  05/26/2018  Dawn Underwood is seen today in annual follow-up.  She is done well with regards her palpitations over the past year.  She continues to exercise at least 3 times a week.  She was exercising more but developed hip problems on the elliptical machine and her orthopedist told her to back off.  She did  have some lab work recently which showed total cholesterol of 157, triglycerides 95, HDL 58 and LDL 82.  She is on low-dose simvastatin.  She takes propranolol primarily for palpitations and has not had an issue with blood pressure.  05/30/2019  Dawn Underwood is seen today in follow-up.  She has very infrequent palpitations.  She continues to walk outside.  Unfortunate she is not been able to go to the gym due to Covid.  She denies any chest pain.  Overall she is pleased with her palpitation control on propranolol.  06/04/2020  Dawn Underwood returns today for follow-up.  More recently she has had episodes of tightness across her chest.  She describes it more as stiffness of her muscles.  She says it comes on and does not feel like squeezing, pressure or heaviness, she denies any tachycardia or palpitations but then when the episode lets off she says she feels very fatigued.  She says she has had about 15 of these episodes in the past few weeks including 2 episodes yesterday.  Initially she thought it was due to sertraline which she was started on for depression/anxiety by her PCP however that was discontinued and her symptoms have persisted.  There is some mild shortness of breath particular walking upstairs but it does not cause her to need to stop.  07/30/2020  Dawn Underwood is seen today in follow-up.  She reports no further episodes of the chest wall stiffening which she described but she said she had another episode which felt more like squeezing as she clenched her fist and held it over her sternum.  This sounded a little more like angina.  It is very difficult to tell.  She did undergo monitoring which showed some brief episodes of SVT as well as NSVT which was slow.  Based on this, I recommended increasing her propranolol.  She says that she feels cooler with this and is concerned about weight gain which are side effect she said she read about, but she also says she feels more calm on the medicine  and after some discussion says she would rather remain on it.  She does not seem to be symptomatic with palpitations.  I am concerned however with the VT and the chest pain symptoms that there could be underlying ischemia.  PMHx:  Past Medical History:  Diagnosis Date  . Acute sinusitis, unspecified   . Anemia   . Cystocele or rectocele with complete uterine prolapse   . Graves' disease   . Hyperlipidemia   . Joint pain   . Light headedness   . Lower back pain   . Other malaise and fatigue     Past Surgical History:  Procedure Laterality Date  . ABDOMINAL HYSTERECTOMY    . bladder mesh    . BREAST BIOPSY Left   . CATARACT EXTRACTION     Bil  . TUBAL LIGATION      FAMHx:  Family History  Problem Relation Age of Onset  . Diabetes Father   . Colon cancer Neg Hx   . Esophageal cancer Neg Hx   . Rectal cancer Neg  Hx   . Stomach cancer Neg Hx     SOCHx:   reports that she quit smoking about 48 years ago. She has never used smokeless tobacco. She reports current alcohol use of about 4.0 standard drinks of alcohol per week. She reports that she does not use drugs.  ALLERGIES:  Allergies  Allergen Reactions  . Erythromycin Other (See Comments)    Pt doesn't remember    ROS: Pertinent items noted in HPI and remainder of comprehensive ROS otherwise negative.  HOME MEDS: Current Outpatient Medications  Medication Sig Dispense Refill  . aspirin EC 81 MG tablet Take 81 mg by mouth daily.    . calcium citrate-vitamin D (CITRACAL+D) 315-200 MG-UNIT tablet Take 1 tablet by mouth 2 (two) times daily. Take 1 tab by mouth in the morning and tabs in the evening    . Cholecalciferol (VITAMIN D3) 50 MCG (2000 UT) TABS Take 1 tablet by mouth daily.    . Coenzyme Q10 100 MG TABS Take by mouth.    . Cyanocobalamin (B-12) 2500 MCG SUBL Place 1 each under the tongue daily.    . Fish Oil-Krill Oil (KRILL OIL PLUS PO) Take by mouth.    . Glucosamine-Chondroit-Vit C-Mn (GLUCOSAMINE 1500  COMPLEX PO) Take 1 tablet by mouth 2 (two) times daily.    . Omega 3-6-9 Fatty Acids (OMEGA 3-6-9 COMPLEX PO) Take by mouth.    . propranolol ER (INDERAL LA) 120 MG 24 hr capsule Take 1 capsule (120 mg total) by mouth daily. 30 capsule 1  . simvastatin (ZOCOR) 10 MG tablet Take 10 mg by mouth at bedtime.     No current facility-administered medications for this visit.    LABS/IMAGING: No results found for this or any previous visit (from the past 48 hour(s)). No results found.  WEIGHTS: Wt Readings from Last 3 Encounters:  07/30/20 171 lb 3.2 oz (77.7 kg)  06/04/20 175 lb (79.4 kg)  05/30/19 173 lb (78.5 kg)    VITALS: BP 136/78 (BP Location: Left Arm, Patient Position: Sitting, Cuff Size: Normal)   Pulse 62   Ht 5' 7.5" (1.715 m)   Wt 171 lb 3.2 oz (77.7 kg)   LMP  (LMP Unknown)   SpO2 92%   BMI 26.42 kg/m   EXAM: Deferred  EKG: Deferred  ASSESSMENT: 1. "Chest wall stiffening followed by fatigue" 2. Symptomatic palpitations - PAC's, PVC's and ventricular bigeminy 3. 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   Dawn Underwood has had some episodes of chest wall stiffening she said followed by fatigue.  Her monitor failed to show any A. fib but she was noted to have PVCs and some nonsustained VT as well as SVT which has been seen in the past.  I increased her propranolol she says she feels well with that and a little less anxious.  I think it is important to exclude ischemia and would recommend a Myoview stress test given her squeezing chest discomfort.  CT angiography is unlikely to be successful given her arrhythmias.  Plan follow-up with me afterwards.  Pixie Casino, MD, [place], [place] Director of the [facility] Lip[facility]  [facility] Diplomate of the American Board of Clinical Lipidology Attending Cardiologist  Direct Dial: [phone]  Fax: [phone]  Website:   [email or web address] 07/30/2020, 12:02 PM

## 2020-08-01 ENCOUNTER — Encounter (HOSPITAL_COMMUNITY): Payer: Self-pay | Admitting: *Deleted

## 2020-08-06 ENCOUNTER — Inpatient Hospital Stay: Admission: RE | Admit: 2020-08-06 | Payer: Medicare Other | Source: Ambulatory Visit

## 2020-08-07 ENCOUNTER — Other Ambulatory Visit: Payer: Self-pay

## 2020-08-07 ENCOUNTER — Ambulatory Visit
Admission: RE | Admit: 2020-08-07 | Discharge: 2020-08-07 | Disposition: A | Discharge status: Home / Self Care | Payer: Medicare Other | Source: Ambulatory Visit | Attending: Family Medicine | Admitting: Family Medicine

## 2020-08-07 DIAGNOSIS — Z1231 Encounter for screening mammogram for malignant neoplasm of breast: Secondary | ICD-10-CM

## 2020-08-08 ENCOUNTER — Other Ambulatory Visit: Payer: Self-pay

## 2020-08-08 ENCOUNTER — Ambulatory Visit (HOSPITAL_COMMUNITY): Payer: Medicare Other | Attending: Cardiovascular Disease

## 2020-08-08 DIAGNOSIS — I4729 Other ventricular tachycardia: Secondary | ICD-10-CM

## 2020-08-08 DIAGNOSIS — I472 Ventricular tachycardia: Secondary | ICD-10-CM | POA: Insufficient documentation

## 2020-08-08 DIAGNOSIS — R079 Chest pain, unspecified: Secondary | ICD-10-CM | POA: Diagnosis not present

## 2020-08-08 LAB — MYOCARDIAL PERFUSION IMAGING
LV dias vol: 63 mL (ref 46–106)
LV sys vol: 18 mL
Peak HR: 90 {beats}/min
Rest HR: 54 {beats}/min
SDS: 0
SRS: 0
SSS: 0
TID: 1.14

## 2020-08-08 MED ORDER — REGADENOSON 0.4 MG/5ML IV SOLN
0.4000 mg | Freq: Once | INTRAVENOUS | Status: AC
  Administered 2020-08-08: 0.4 mg via INTRAVENOUS

## 2020-08-08 MED ORDER — TECHNETIUM TC 99M TETROFOSMIN IV KIT
9.4000 | PACK | Freq: Once | INTRAVENOUS | Status: AC | PRN
  Administered 2020-08-08: 9.4 via INTRAVENOUS
  Filled 2020-08-08: qty 10

## 2020-08-08 MED ORDER — TECHNETIUM TC 99M TETROFOSMIN IV KIT
29.3000 | PACK | Freq: Once | INTRAVENOUS | Status: AC | PRN
  Administered 2020-08-08: 29.3 via INTRAVENOUS
  Filled 2020-08-08: qty 30

## 2020-08-31 ENCOUNTER — Other Ambulatory Visit: Payer: Self-pay | Admitting: Internal Medicine

## 2020-08-31 DIAGNOSIS — R002 Palpitations: Secondary | ICD-10-CM

## 2020-10-11 ENCOUNTER — Other Ambulatory Visit: Payer: Self-pay | Admitting: Family Medicine

## 2020-10-11 DIAGNOSIS — Z8739 Personal history of other diseases of the musculoskeletal system and connective tissue: Secondary | ICD-10-CM

## 2020-11-07 ENCOUNTER — Encounter: Payer: Self-pay | Admitting: Internal Medicine

## 2020-11-07 ENCOUNTER — Other Ambulatory Visit: Payer: Self-pay

## 2020-11-07 ENCOUNTER — Ambulatory Visit: Payer: Medicare Other | Admitting: Internal Medicine

## 2020-11-07 VITALS — BP 118/72 | HR 54 | Ht 67.0 in | Wt 171.0 lb

## 2020-11-07 DIAGNOSIS — R002 Palpitations: Secondary | ICD-10-CM

## 2020-11-07 DIAGNOSIS — R079 Chest pain, unspecified: Secondary | ICD-10-CM | POA: Diagnosis not present

## 2020-11-07 NOTE — Patient Instructions (Signed)

## 2020-11-07 NOTE — Progress Notes (Signed)
OFFICE NOTE  Chief Complaint:  Follow-up chest wall stiffening  Primary Care Physician: Aletha Halim., PA-C  HPI:  Dawn Underwood is a 70 y.o. female who is currently referred to me for evaluation of palpitations. She reported that she's had palpitations on and off for years however recently had worsening symptoms after a bout of colitis with bright red blood per rectum. This ultimately resolved. She then had problems regulating her thyroid was referred to Dr. Dagmar Hait for further evaluation. She does have a history of graves disease. There is also underlying anxiety and of course palpitations in the past. Family history significant for some coronary disease in her grandparents and she has dyslipidemia and questionable hypertension (she's had both hypotension and hypertension at times, and may have white coat hypertension). In February she was taken the emergency department after an episode where she fell of flushing feeling over her body as well as her heart pounding out of her chest. She became very anxious about this and resented to the emergency department. She said she waited in the ER for several hours and her symptoms resolved prior to being completely evaluated. Ultimately she was told that this was a "panic attack". Since then she's had no other significant episodes but several other smaller episodes of palpitations. She also reports some left shoulder pain which is sharp and may be related to these palpitations. In addition there is some mild shortness of breath.  10/02/15  Dawn Underwood returns today for follow-up of her studies. She underwent an exercise treadmill stress test which was low risk for ischemia. She had an echocardiogram which showed EF 55-60% with normal systolic function, mild diastolic dysfunction trivial MR and mild TR. She did wear a monitor which demonstrated PVCs, bigeminal PVCs and PACs, likely the cause of her palpitations. She reports that they were  particularly bothersome this weekend however she was under significant stress and anxiety related to following around her grandchildren over the holiday.  10/29/2015  Dawn Underwood was seen back today in follow-up. She says her palpitations have nearly resolved on propranolol. She also thinks she's had improvement in her anxiety. She's not had take any additional Xanax since we last saw her month ago.  05/30/2016  Dawn Underwood returns today for follow-up. She reports a recently she's been having some more palpitations. In general are not nearly as bad as before she started propranolol but have become a little worse. She is struggling with some pain in her feet and has a heel spur. She says is difficult for her to walk and has been more anxious because of this. She is using Xanax but sparingly. In general that seems to help with her palpitations as well.  06/01/2017  Dawn Underwood was seen today in annual follow-up.  She reports her palpitations have been very well controlled.  Blood pressure is at goal.  She exercises 3-5 days a week without any limitations.  She reports 2 episodes over the past year of feeling of hot and flushed with some associated vomiting or diarrhea.  She is also had some morning vomiting after taking her medications and drinking a cup of coffee.  She typically does not have any food on her stomach that time.  It was thought that these might be hot flashes however she does not feel like there typically associated with that.  To me these episodes sound like reflux.  We talked about ways to try to reduce reflux and that she could consider  over-the-counter H2 blockers or PPIs.  05/26/2018  Dawn Underwood is seen today in annual follow-up.  She is done well with regards her palpitations over the past year.  She continues to exercise at least 3 times a week.  She was exercising more but developed hip problems on the elliptical machine and her orthopedist told her to back off.  She did  have some lab work recently which showed total cholesterol of 157, triglycerides 95, HDL 58 and LDL 82.  She is on low-dose simvastatin.  She takes propranolol primarily for palpitations and has not had an issue with blood pressure.  05/30/2019  Dawn Underwood is seen today in follow-up.  She has very infrequent palpitations.  She continues to walk outside.  Unfortunate she is not been able to go to the gym due to Covid.  She denies any chest pain.  Overall she is pleased with her palpitation control on propranolol.  06/04/2020  Dawn Underwood returns today for follow-up.  More recently she has had episodes of tightness across her chest.  She describes it more as stiffness of her muscles.  She says it comes on and does not feel like squeezing, pressure or heaviness, she denies any tachycardia or palpitations but then when the episode lets off she says she feels very fatigued.  She says she has had about 15 of these episodes in the past few weeks including 2 episodes yesterday.  Initially she thought it was due to sertraline which she was started on for depression/anxiety by her PCP however that was discontinued and her symptoms have persisted.  There is some mild shortness of breath particular walking upstairs but it does not cause her to need to stop.  07/30/2020  Dawn Underwood is seen today in follow-up.  She reports no further episodes of the chest wall stiffening which she described but she said she had another episode which felt more like squeezing as she clenched her fist and held it over her sternum.  This sounded a little more like angina.  It is very difficult to tell.  She did undergo monitoring which showed some brief episodes of SVT as well as NSVT which was slow.  Based on this, I recommended increasing her propranolol.  She says that she feels cooler with this and is concerned about weight gain which are side effect she said she read about, but she also says she feels more calm on the medicine  and after some discussion says she would rather remain on it.  She does not seem to be symptomatic with palpitations.  I am concerned however with the VT and the chest pain symptoms that there could be underlying ischemia.  11/07/2020  Dawn Underwood returns today for follow-up.  She underwent stress testing in April which was negative for ischemia and showed normal LV function.  Overall she feels improved.  She is now taking propranolol at night and has less palpitations.  Blood pressure is well controlled today.  PMHx:  Past Medical History:  Diagnosis Date   Acute sinusitis, unspecified    Anemia    Cystocele or rectocele with complete uterine prolapse    Graves' disease    Hyperlipidemia    Joint pain    Light headedness    Lower back pain    Other malaise and fatigue     Past Surgical History:  Procedure Laterality Date   ABDOMINAL HYSTERECTOMY     bladder mesh     BREAST BIOPSY Left  CATARACT EXTRACTION     Bil   TUBAL LIGATION      FAMHx:  Family History  Problem Relation Age of Onset   Diabetes Father    Colon cancer Neg Hx    Esophageal cancer Neg Hx    Rectal cancer Neg Hx    Stomach cancer Neg Hx     SOCHx:   reports that she quit smoking about 48 years ago. Her smoking use included cigarettes. She has never used smokeless tobacco. She reports current alcohol use of about 4.0 standard drinks of alcohol per week. She reports that she does not use drugs.  ALLERGIES:  Allergies  Allergen Reactions   Erythromycin Other (See Comments)    Pt doesn't remember    ROS: Pertinent items noted in HPI and remainder of comprehensive ROS otherwise negative.  HOME MEDS: Current Outpatient Medications  Medication Sig Dispense Refill   aspirin EC 81 MG tablet Take 81 mg by mouth daily.     calcium citrate-vitamin D (CITRACAL+D) 315-200 MG-UNIT tablet Take 1 tablet by mouth 2 (two) times daily. Take 1 tab by mouth in the morning and tabs in the evening      Cholecalciferol (VITAMIN D3) 50 MCG (2000 UT) TABS Take 1 tablet by mouth daily. Patient takes 5000 units daily     Coenzyme Q10 100 MG TABS Take by mouth daily.     Cyanocobalamin (B-12) 2500 MCG SUBL Place 1 each under the tongue daily.     Fish Oil-Krill Oil (KRILL OIL PLUS PO) Take by mouth.     Glucosamine-Chondroit-Vit C-Mn (GLUCOSAMINE 1500 COMPLEX PO) Take 1 tablet by mouth 2 (two) times daily.     propranolol ER (INDERAL LA) 120 MG 24 hr capsule TAKE ONE CAPSULE BY MOUTH DAILY 90 capsule 3   sertraline (ZOLOFT) 50 MG tablet Take 50 mg by mouth daily.     simvastatin (ZOCOR) 10 MG tablet Take 10 mg by mouth at bedtime.     Omega 3-6-9 Fatty Acids (OMEGA 3-6-9 COMPLEX PO) Take by mouth. (Patient not taking: Reported on 11/07/2020)     No current facility-administered medications for this visit.    LABS/IMAGING: No results found for this or any previous visit (from the past 48 hour(s)). No results found.  WEIGHTS: Wt Readings from Last 3 Encounters:  11/07/20 171 lb (77.6 kg)  08/08/20 171 lb (77.6 kg)  07/30/20 171 lb 3.2 oz (77.7 kg)    VITALS: BP 118/72 (BP Location: Left Arm, Patient Position: Sitting, Cuff Size: Normal)   Pulse (!) 54   Ht 5\' 7"  (1.702 m)   Wt 171 lb (77.6 kg)   LMP  (LMP Unknown)   SpO2 98%   BMI 26.78 kg/m   EXAM: General appearance: alert and no distress Lungs: clear to auscultation bilaterally Heart: regular rate and rhythm, S1, S2 normal, no murmur, click, rub or gallop Extremities: varicose veins noted  EKG: Sinus bradycardia 54-personally reviewed  ASSESSMENT: "Chest wall stiffening followed by fatigue" Symptomatic palpitations - PAC's, PVC's and ventricular bigeminy Anxiety 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   Dawn Underwood has had some episodes of chest wall stiffening she said followed by fatigue.  She reports improvement in her palpitations.  She is now on propranolol at night.  Her stress test in April was  negative for ischemia and showed normal LV function.  Overall she feels better.  No medication changes today.  Follow-up with me annually or sooner as necessary.  Nadean Corwin  Debara Pickett, MD, [facility], [place] Director of the Advanced Lipid Disorders &  Cardiovascular Risk Reduction Clinic Diplomate of the American Board of Clinical Lipidology Attending Cardiologist  Direct Dial: [phone]  Fax: [phone]  Website:  [email or web address] 11/07/2020, 10:54 AM

## 2021-03-19 ENCOUNTER — Other Ambulatory Visit: Payer: Self-pay | Admitting: Family Medicine

## 2021-03-19 DIAGNOSIS — N6313 Unspecified lump in the right breast, lower outer quadrant: Secondary | ICD-10-CM

## 2021-03-27 ENCOUNTER — Ambulatory Visit
Admission: RE | Admit: 2021-03-27 | Discharge: 2021-03-27 | Disposition: A | Discharge status: Home / Self Care | Payer: Medicare Other | Source: Ambulatory Visit | Attending: Family Medicine | Admitting: Family Medicine

## 2021-03-27 DIAGNOSIS — Z8739 Personal history of other diseases of the musculoskeletal system and connective tissue: Secondary | ICD-10-CM

## 2021-05-03 ENCOUNTER — Other Ambulatory Visit: Payer: Medicare Other

## 2021-07-19 ENCOUNTER — Other Ambulatory Visit: Payer: Self-pay | Admitting: Family Medicine

## 2021-07-19 DIAGNOSIS — Z1231 Encounter for screening mammogram for malignant neoplasm of breast: Secondary | ICD-10-CM

## 2021-08-09 ENCOUNTER — Other Ambulatory Visit: Payer: Self-pay | Admitting: Internal Medicine

## 2021-08-09 DIAGNOSIS — R002 Palpitations: Secondary | ICD-10-CM

## 2021-08-20 ENCOUNTER — Ambulatory Visit
Admission: RE | Admit: 2021-08-20 | Discharge: 2021-08-20 | Disposition: A | Discharge status: Home / Self Care | Payer: Medicare Other | Source: Ambulatory Visit | Attending: Family Medicine | Admitting: Family Medicine

## 2021-08-20 DIAGNOSIS — Z1231 Encounter for screening mammogram for malignant neoplasm of breast: Secondary | ICD-10-CM

## 2021-12-19 ENCOUNTER — Encounter: Payer: Self-pay | Admitting: Internal Medicine

## 2021-12-19 ENCOUNTER — Ambulatory Visit: Payer: Medicare Other | Admitting: Internal Medicine

## 2021-12-19 VITALS — BP 112/78 | HR 59 | Ht 67.0 in | Wt 175.2 lb

## 2021-12-19 DIAGNOSIS — E785 Hyperlipidemia, unspecified: Secondary | ICD-10-CM | POA: Diagnosis not present

## 2021-12-19 DIAGNOSIS — R002 Palpitations: Secondary | ICD-10-CM

## 2021-12-19 NOTE — Progress Notes (Signed)
OFFICE NOTE  Chief Complaint:  Follow-up chest wall stiffening  Primary Care Physician: Aletha Halim., PA-C  HPI:  Dawn Underwood is a 71 y.o. female who is currently referred to me for evaluation of palpitations. She reported that she's had palpitations on and off for years however recently had worsening symptoms after a bout of colitis with bright red blood per rectum. This ultimately resolved. She then had problems regulating her thyroid was referred to Dr. Dagmar Hait for further evaluation. She does have a history of graves disease. There is also underlying anxiety and of course palpitations in the past. Family history significant for some coronary disease in her grandparents and she has dyslipidemia and questionable hypertension (she's had both hypotension and hypertension at times, and may have white coat hypertension). In February she was taken the emergency department after an episode where she fell of flushing feeling over her body as well as her heart pounding out of her chest. She became very anxious about this and resented to the emergency department. She said she waited in the ER for several hours and her symptoms resolved prior to being completely evaluated. Ultimately she was told that this was a "panic attack". Since then she's had no other significant episodes but several other smaller episodes of palpitations. She also reports some left shoulder pain which is sharp and may be related to these palpitations. In addition there is some mild shortness of breath.  10/02/15  Dawn Underwood returns today for follow-up of her studies. She underwent an exercise treadmill stress test which was low risk for ischemia. She had an echocardiogram which showed EF 55-60% with normal systolic function, mild diastolic dysfunction trivial MR and mild TR. She did wear a monitor which demonstrated PVCs, bigeminal PVCs and PACs, likely the cause of her palpitations. She reports that they were  particularly bothersome this weekend however she was under significant stress and anxiety related to following around her grandchildren over the holiday.  10/29/2015  Dawn Underwood was seen back today in follow-up. She says her palpitations have nearly resolved on propranolol. She also thinks she's had improvement in her anxiety. She's not had take any additional Xanax since we last saw her month ago.  05/30/2016  Dawn Underwood returns today for follow-up. She reports a recently she's been having some more palpitations. In general are not nearly as bad as before she started propranolol but have become a little worse. She is struggling with some pain in her feet and has a heel spur. She says is difficult for her to walk and has been more anxious because of this. She is using Xanax but sparingly. In general that seems to help with her palpitations as well.  06/01/2017  Dawn Underwood was seen today in annual follow-up.  She reports her palpitations have been very well controlled.  Blood pressure is at goal.  She exercises 3-5 days a week without any limitations.  She reports 2 episodes over the past year of feeling of hot and flushed with some associated vomiting or diarrhea.  She is also had some morning vomiting after taking her medications and drinking a cup of coffee.  She typically does not have any food on her stomach that time.  It was thought that these might be hot flashes however she does not feel like there typically associated with that.  To me these episodes sound like reflux.  We talked about ways to try to reduce reflux and that she could consider  over-the-counter H2 blockers or PPIs.  05/26/2018  Dawn Underwood is seen today in annual follow-up.  She is done well with regards her palpitations over the past year.  She continues to exercise at least 3 times a week.  She was exercising more but developed hip problems on the elliptical machine and her orthopedist told her to back off.  She did  have some lab work recently which showed total cholesterol of 157, triglycerides 95, HDL 58 and LDL 82.  She is on low-dose simvastatin.  She takes propranolol primarily for palpitations and has not had an issue with blood pressure.  05/30/2019  Dawn Underwood is seen today in follow-up.  She has very infrequent palpitations.  She continues to walk outside.  Unfortunate she is not been able to go to the gym due to Covid.  She denies any chest pain.  Overall she is pleased with her palpitation control on propranolol.  06/04/2020  Dawn Underwood returns today for follow-up.  More recently she has had episodes of tightness across her chest.  She describes it more as stiffness of her muscles.  She says it comes on and does not feel like squeezing, pressure or heaviness, she denies any tachycardia or palpitations but then when the episode lets off she says she feels very fatigued.  She says she has had about 15 of these episodes in the past few weeks including 2 episodes yesterday.  Initially she thought it was due to sertraline which she was started on for depression/anxiety by her PCP however that was discontinued and her symptoms have persisted.  There is some mild shortness of breath particular walking upstairs but it does not cause her to need to stop.  07/30/2020  Dawn Underwood is seen today in follow-up.  She reports no further episodes of the chest wall stiffening which she described but she said she had another episode which felt more like squeezing as she clenched her fist and held it over her sternum.  This sounded a little more like angina.  It is very difficult to tell.  She did undergo monitoring which showed some brief episodes of SVT as well as NSVT which was slow.  Based on this, I recommended increasing her propranolol.  She says that she feels cooler with this and is concerned about weight gain which are side effect she said she read about, but she also says she feels more calm on the medicine  and after some discussion says she would rather remain on it.  She does not seem to be symptomatic with palpitations.  I am concerned however with the VT and the chest pain symptoms that there could be underlying ischemia.  11/07/2020  Dawn Underwood returns today for follow-up.  She underwent stress testing in April which was negative for ischemia and showed normal LV function.  Overall she feels improved.  She is now taking propranolol at night and has less palpitations.  Blood pressure is well controlled today.  12/19/2021  Dawn Underwood is seen today in follow-up.  She reports good control of her palpitations on propranolol.  Blood pressure is excellent today 112/78.  EKG shows a sinus bradycardia at 59.  She had recent labs which show good cholesterol control on lower dose simvastatin.  She has stopped taking aspirin.  She was concerned about easy bruising.  She has had some fatigue although is under a lot of stress.  Her husband has been diagnosed with Parkinson's in her daughter has aplastic anemia.  PMHx:  Past Medical History:  Diagnosis Date   Acute sinusitis, unspecified    Anemia    Cystocele or rectocele with complete uterine prolapse    Graves' disease    Hyperlipidemia    Joint pain    Light headedness    Lower back pain    Other malaise and fatigue     Past Surgical History:  Procedure Laterality Date   ABDOMINAL HYSTERECTOMY     bladder mesh     BREAST BIOPSY Left    CATARACT EXTRACTION     Bil   TUBAL LIGATION      FAMHx:  Family History  Problem Relation Age of Onset   Diabetes Father    Colon cancer Neg Hx    Esophageal cancer Neg Hx    Rectal cancer Neg Hx    Stomach cancer Neg Hx     SOCHx:   reports that she quit smoking about 49 years ago. Her smoking use included cigarettes. She has never used smokeless tobacco. She reports current alcohol use of about 4.0 standard drinks of alcohol per week. She reports that she does not use drugs.  ALLERGIES:   Allergies  Allergen Reactions   Erythromycin Other (See Comments)    Pt doesn't remember    ROS: Pertinent items noted in HPI and remainder of comprehensive ROS otherwise negative.  HOME MEDS: Current Outpatient Medications  Medication Sig Dispense Refill   calcium citrate-vitamin D (CITRACAL+D) 315-200 MG-UNIT tablet Take 1 tablet by mouth 2 (two) times daily. Take 1 tab by mouth in the morning and tabs in the evening     Cholecalciferol (VITAMIN D3) 50 MCG (2000 UT) TABS Take 1 tablet by mouth daily. Patient takes 5000 units daily     Cyanocobalamin (B-12) 2500 MCG SUBL Place 1 each under the tongue daily.     Fish Oil-Krill Oil (KRILL OIL PLUS PO) Take by mouth.     Glucosamine-Chondroit-Vit C-Mn (GLUCOSAMINE 1500 COMPLEX PO) Take 1 tablet by mouth 2 (two) times daily.     propranolol ER (INDERAL LA) 120 MG 24 hr capsule TAKE ONE CAPSULE BY MOUTH DAILY 90 capsule 3   sertraline (ZOLOFT) 50 MG tablet Take 50 mg by mouth daily.     simvastatin (ZOCOR) 10 MG tablet Take 10 mg by mouth at bedtime.     aspirin EC 81 MG tablet Take 81 mg by mouth daily.     Coenzyme Q10 100 MG TABS Take by mouth daily.     Omega 3-6-9 Fatty Acids (OMEGA 3-6-9 COMPLEX PO) Take by mouth. (Patient not taking: Reported on 11/07/2020)     No current facility-administered medications for this visit.    LABS/IMAGING: No results found for this or any previous visit (from the past 48 hour(s)). No results found.  WEIGHTS: Wt Readings from Last 3 Encounters:  12/19/21 175 lb 3.2 oz (79.5 kg)  11/07/20 171 lb (77.6 kg)  08/08/20 171 lb (77.6 kg)    VITALS: BP 112/78   Pulse (!) 59   Ht '5\' 7"'$  (1.702 m)   Wt 175 lb 3.2 oz (79.5 kg)   LMP  (LMP Unknown)   BMI 27.44 kg/m   EXAM: General appearance: alert and no distress Neck: no carotid bruit, no JVD, and thyroid not enlarged, symmetric, no tenderness/mass/nodules Lungs: clear to auscultation bilaterally Heart: regular rate and rhythm, S1, S2 normal,  no murmur, click, rub or gallop Abdomen: soft, non-tender; bowel sounds normal; no masses,  no organomegaly Extremities: varicose veins noted  Pulses: 2+ and symmetric Skin: Skin color, texture, turgor normal. 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   Dawn Underwood seems to be doing better without any symptomatic palpitations.  Heart rate is well controlled and blood pressure is excellent.  She has been under a lot of stress with her husband recently diagnosed with Parkinson's and her daughter who has aplastic anemia.  Encouraged her to continue her exercise for stress management and health.  No changes to her medicines today.  Plan follow-up annually or sooner as necessary.  Pixie Casino, MD, [facility], [place] Director of the Advanced Lipid Disorders &  Cardiovascular Risk Reduction Clinic Diplomate of the American Board of Clinical Lipidology Attending Cardiologist  Direct Dial: [phone]  Fax: [phone]  Website:  [email or web address] 12/19/2021, 8:49 AM

## 2021-12-19 NOTE — Patient Instructions (Signed)
Medication Instructions:  NO CHANGES  *If you need a refill on your cardiac medications before your next appointment, please call your pharmacy*    Follow-Up: At [facility], you and your health needs are our priority.  As part of our continuing mission to provide you with exceptional heart care, we have created designated Provider Care Teams.  These Care Teams include your primary Cardiologist (physician) and Advanced Practice Providers (APPs -  Physician Assistants and Nurse Practitioners) who all work together to provide you with the care you need, when you need it.  We recommend signing up for the patient portal called "MyChart".  Sign up information is provided on this After Visit Summary.  MyChart is used to connect with patients for Virtual Visits (Telemedicine).  Patients are able to view lab/test results, encounter notes, upcoming appointments, etc.  Non-urgent messages can be sent to your provider as well.   To learn more about what you can do with MyChart, go to [email or web address].    Your next appointment:   12 month(s)  The format for your next appointment:   In Person  Provider:   Pixie Casino, MD {

## 2022-02-04 ENCOUNTER — Encounter: Payer: Self-pay | Admitting: Gastroenterology

## 2022-02-21 ENCOUNTER — Telehealth: Payer: Self-pay | Admitting: Gastroenterology

## 2022-02-21 ENCOUNTER — Ambulatory Visit: Payer: Medicare Other | Admitting: Gastroenterology

## 2022-02-21 ENCOUNTER — Encounter: Payer: Self-pay | Admitting: Gastroenterology

## 2022-02-21 VITALS — BP 118/82 | HR 53 | Ht 67.0 in | Wt 174.0 lb

## 2022-02-21 DIAGNOSIS — R12 Heartburn: Secondary | ICD-10-CM

## 2022-02-21 DIAGNOSIS — Z86010 Personal history of colonic polyps: Secondary | ICD-10-CM

## 2022-02-21 MED ORDER — FAMOTIDINE 20 MG PO TABS: 20.0000 mg | ORAL_TABLET | Freq: Two times a day (BID) | ORAL | 6 refills | Status: DC | PRN

## 2022-02-21 MED ORDER — NA SULFATE-K SULFATE-MG SULF 17.5-3.13-1.6 GM/177ML PO SOLN: 1.0000 | Freq: Once | ORAL | 0 refills | Status: AC

## 2022-02-21 NOTE — Telephone Encounter (Signed)
Inbound call from patient stating her prep medication is $90. Patient is inquiring if she can be prescribed something else.  Thank you

## 2022-02-21 NOTE — Progress Notes (Signed)
Referring Provider: Richmond Campbell., PA-C Primary Care Physician:  Richmond Campbell., PA-C  Reason for Consultation: Heartburn   IMPRESSION:  Heartburn/Dyspepsia. Recent onset of symptoms. EGD recommended to exclude malignancy, PUD, esophagitis, H pylori, gastritis.   ? Blood in the stool versus hematuria. Already proceeding with surveillance colonoscopy. She is encouraged to follow-up with her PCP to determine if additional evaluation of hematuria is indicated.   Need for colon cancer screening.  3 tubular adenomas removed on colonoscopy in 2020.  Surveillance due this year.  PLAN: - Continue Pepcid 20 mg BID PRN - Briefly discussed lifestyle modifications for reflux - EGD - Surveillance colonoscopy   HPI: Dawn Underwood is a 71 y.o. female referred by PA Arlyce Dice for reflux and colon cancer screening.  I last saw her for colonoscopy in 2020.  The interval history is obtained through the patient and review of her electronic health record.  She has a history of arthritis, cystocele, Graves' disease, hypercholesterolemia, hypertension, osteopenia, rectocele.  She presents today with dyspepsia and brash. The patient denies alarm symptoms including dysphagia, early satiety, hematemesis, hematochezia, melena, odynophagia, and weight loss.  She has seen some blood in the toilet bowl, but she isn't sure if this was in her urine. She has also seen some blood on a pad.   She was using Tums as needed and more recently had improvement with using Pepcid.  There is been no change in bowel habits. She is using Metamucil on days that she has not had a bowel movement.   Endoscopic history: Normal screening colonoscopy with Dr. Dickie La in 2006 and 2014.   Colonoscopy with me in 2020 revealed three 2 to 4 mm tubular adenomas removed during her colonoscopy in 2020.  Her colon was tortuous and redundant.  Surveillance colonoscopy recommended in 3 years.  She has tolerated the procedure well in the  past.   Since her last visit here her husband has been diagnosed with Parkinson's and her daughter has been receiving care for aplastic anemia at [facility].  Mother had ovarian cancer.  There is no known family history of colon cancer or polyps. No family history of stomach cancer or other GI malignancy. No family history of inflammatory bowel disease or celiac.    Past Medical History:  Diagnosis Date   Acute sinusitis, unspecified    Anemia    Cystocele or rectocele with complete uterine prolapse    Graves' disease    Hyperlipidemia    Joint pain    Light headedness    Lower back pain    Other malaise and fatigue     Past Surgical History:  Procedure Laterality Date   ABDOMINAL HYSTERECTOMY     bladder mesh     BREAST BIOPSY Left    CATARACT EXTRACTION     Bil   TUBAL LIGATION       Current Outpatient Medications  Medication Sig Dispense Refill   calcium citrate-vitamin D (CITRACAL+D) 315-200 MG-UNIT tablet Take 1 tablet by mouth 2 (two) times daily. Take 1 tab by mouth in the morning and tabs in the evening     Cholecalciferol (VITAMIN D3) 50 MCG (2000 UT) TABS Take 1 tablet by mouth daily. Patient takes 5000 units daily     Cyanocobalamin (B-12) 2500 MCG SUBL Place 1 each under the tongue daily.     famotidine (PEPCID) 20 MG tablet Take 1 tablet (20 mg total) by mouth 2 (two) times daily as needed for heartburn or indigestion. 60  tablet 6   Fish Oil-Krill Oil (KRILL OIL PLUS PO) Take by mouth.     Glucosamine-Chondroit-Vit C-Mn (GLUCOSAMINE 1500 COMPLEX PO) Take 1 tablet by mouth 2 (two) times daily.     Na Sulfate-K Sulfate-Mg Sulf 17.5-3.13-1.6 GM/177ML SOLN Take 1 kit by mouth once for 1 dose. 354 mL 0   propranolol ER (INDERAL LA) 120 MG 24 hr capsule TAKE ONE CAPSULE BY MOUTH DAILY 90 capsule 3   sertraline (ZOLOFT) 50 MG tablet Take 50 mg by mouth daily.     simvastatin (ZOCOR) 10 MG tablet Take 10 mg by mouth at bedtime.     Coenzyme Q10 100 MG TABS Take by mouth  daily.     Omega 3-6-9 Fatty Acids (OMEGA 3-6-9 COMPLEX PO) Take by mouth. (Patient not taking: Reported on 11/07/2020)     No current facility-administered medications for this visit.    Allergies as of 02/21/2022 - Review Complete 02/21/2022  Allergen Reaction Noted   Erythromycin Other (See Comments) 10/17/2011    Family History  Problem Relation Age of Onset   Diabetes Father    Colon cancer Neg Hx    Esophageal cancer Neg Hx    Rectal cancer Neg Hx    Stomach cancer Neg Hx      Physical Exam: General:   Alert,  well-nourished, pleasant and cooperative in NAD Head:  Normocephalic and atraumatic. Eyes:  Sclera clear, no icterus.   Conjunctiva pink. Ears:  Normal auditory acuity. Nose:  No deformity, discharge,  or lesions. Mouth:  No deformity or lesions.   Neck:  Supple; no masses or thyromegaly. Lungs:  Clear throughout to auscultation.   No wheezes. Heart:  Regular rate and rhythm; no murmurs. Abdomen:  Soft, nontender, nondistended, normal bowel sounds, no rebound or guarding. No hepatosplenomegaly.   Rectal:  Deferred  Msk:  Symmetrical. No boney deformities LAD: No inguinal or umbilical LAD Extremities:  No clubbing or edema. Neurologic:  Alert and  oriented x4;  grossly nonfocal Skin:  Intact without significant lesions or rashes. Psych:  Alert and cooperative. Normal mood and affect.    Dawn Daponte L. Tarri Glenn, MD, MPH 02/21/2022, 9:47 AM

## 2022-02-21 NOTE — Patient Instructions (Addendum)
It was my pleasure to provide care to you today. Based on our discussion, I am providing you with my recommendations below:  RECOMMENDATION(S):   I recommend an upper endoscopy at the time of your colonoscopy.  In the meantime, continue to use famotidine 20 mg as needed for heartburn.  COLONOSCOPY:   You have been scheduled for a colonoscopy. Please follow written instructions given to you at your visit today.   PREP:   Please pick up your prep supplies at the pharmacy within the next 1-3 days.  INHALERS:   If you use inhalers (even only as needed), please bring them with you on the day of your procedure.  COLONOSCOPY TIPS:  To reduce nausea and dehydration, stay well hydrated for 3-4 days prior to the exam.  To prevent skin/hemorrhoid irritation - prior to wiping, put A&Dointment or vaseline on the toilet paper. Keep a towel or pad on the bed.  BEFORE STARTING YOUR PREP, drink  64oz of clear liquids in the morning. This will help to flush the colon and will ensure you are well hydrated!!!!  NOTE - This is in addition to the fluids required for to complete your prep. Use of a flavored hard candy, such as grape Anise Salvo, can counteract some of the flavor of the prep and may prevent some nausea.    FOLLOW UP:  After your procedure, you will receive a call from my office staff regarding my recommendation for follow up.  BMI:  If you are age 36 or older, your body mass index should be between 23-30. Your Body mass index is 27.25 kg/m. If this is out of the aforementioned range listed, please consider follow up with your Primary Care Provider.  If you are age 16 or younger, your body mass index should be between 19-25. Your Body mass index is 27.25 kg/m. If this is out of the aformentioned range listed, please consider follow up with your Primary Care Provider.   MY CHART:  The [place] [facility] providers would like to encourage you to use [facility] to communicate with providers  for non-urgent requests or questions.  Due to long hold times on the telephone, sending your provider a message by [facility] may be a faster and more efficient way to get a response.  Please allow 48 business hours for a response.  Please remember that this is for non-urgent requests.   Thank you for trusting me with your gastrointestinal care!    Thornton Park, MD, MPH

## 2022-02-28 NOTE — Telephone Encounter (Signed)
Spoke with patient and she used the [employer] card and was able to pick up her prep for $30.

## 2022-03-20 ENCOUNTER — Encounter: Payer: Self-pay | Admitting: Gastroenterology

## 2022-03-20 ENCOUNTER — Ambulatory Visit (AMBULATORY_SURGERY_CENTER): Payer: Medicare Other | Admitting: Gastroenterology

## 2022-03-20 VITALS — BP 86/50 | HR 66 | Temp 97.5°F | Resp 14 | Ht 67.0 in | Wt 174.0 lb

## 2022-03-20 DIAGNOSIS — K2970 Gastritis, unspecified, without bleeding: Secondary | ICD-10-CM | POA: Diagnosis not present

## 2022-03-20 DIAGNOSIS — K219 Gastro-esophageal reflux disease without esophagitis: Secondary | ICD-10-CM

## 2022-03-20 DIAGNOSIS — K635 Polyp of colon: Secondary | ICD-10-CM

## 2022-03-20 DIAGNOSIS — Z09 Encounter for follow-up examination after completed treatment for conditions other than malignant neoplasm: Secondary | ICD-10-CM | POA: Diagnosis present

## 2022-03-20 DIAGNOSIS — R12 Heartburn: Secondary | ICD-10-CM

## 2022-03-20 DIAGNOSIS — Z86010 Personal history of colonic polyps: Secondary | ICD-10-CM | POA: Diagnosis not present

## 2022-03-20 DIAGNOSIS — D122 Benign neoplasm of ascending colon: Secondary | ICD-10-CM

## 2022-03-20 DIAGNOSIS — K3189 Other diseases of stomach and duodenum: Secondary | ICD-10-CM | POA: Diagnosis not present

## 2022-03-20 MED ORDER — SODIUM CHLORIDE 0.9 % IV SOLN: 500.0000 mL | INTRAVENOUS | Status: DC

## 2022-03-20 NOTE — Op Note (Signed)
[facility] Patient Name: Dawn Underwood Procedure Date: 03/20/2022 1:30 PM MRN: [id number] Endoscopist: Thornton Park MD, MD, [id number] Age: 71 Referring MD:  Date of Birth: Dec 29, 1950 Gender: Female Account #: [id number] Procedure:                Colonoscopy Indications:              High risk colon cancer surveillance: Personal                            history of multiple (3 or more) adenomas                           Normal screening colonoscopy with Dr. Maurene Capes in 2006                            and 2014.                           3 tubular adenomas on colonoscopy 2020 Medicines:                Monitored Anesthesia Care Procedure:                Pre-Anesthesia Assessment:                           - Prior to the procedure, a History and Physical                            was performed, and patient medications and                            allergies were reviewed. The patient's tolerance of                            previous anesthesia was also reviewed. The risks                            and benefits of the procedure and the sedation                            options and risks were discussed with the patient.                            All questions were answered, and informed consent                            was obtained. Prior Anticoagulants: The patient has                            taken no anticoagulant or antiplatelet agents. ASA                            Grade Assessment: II - A patient with mild systemic  disease. After reviewing the risks and benefits,                            the patient was deemed in satisfactory condition to                            undergo the procedure.                           After obtaining informed consent, the colonoscope                            was passed under direct vision. Throughout the                            procedure, the patient's blood pressure, pulse, and                             oxygen saturations were monitored continuously. The                            Olympus CF-HQ190L [id number]) Colonoscope was                            introduced through the anus and advanced to the the                            cecum, identified by appendiceal orifice and                            ileocecal valve. A second forward view of the right                            colon was performed. The colonoscopy was performed                            without difficulty. The patient tolerated the                            procedure well. The quality of the bowel                            preparation was good. The terminal ileum, ileocecal                            valve, appendiceal orifice, and rectum were                            photographed. Scope In: 1:55:24 PM Scope Out: 2:05:27 PM Scope Withdrawal Time: 0 hours 8 minutes 11 seconds  Total Procedure Duration: 0 hours 10 minutes 3 seconds  Findings:                 Non-bleeding external and internal hemorrhoids were  found. The hemorrhoids were moderate.                           A 2 mm polyp was found in the distal ascending                            colon. The polyp was sessile. The polyp was removed                            with a cold snare. Resection and retrieval were                            complete. Estimated blood loss was minimal.                           The exam was otherwise without abnormality on                            direct and retroflexion views. Complications:            No immediate complications. Estimated Blood Loss:     Estimated blood loss was minimal. Impression:               - Non-bleeding external and internal hemorrhoids.                           - One 2 mm polyp in the distal ascending colon,                            removed with a cold snare. Resected and retrieved.                           - The examination was otherwise normal on direct                             and retroflexion views. Recommendation:           - Patient has a contact number available for                            emergencies. The signs and symptoms of potential                            delayed complications were discussed with the                            patient. Return to normal activities tomorrow.                            Written discharge instructions were provided to the                            patient.                           -  Resume previous diet.                           - Continue present medications.                           - Await pathology results.                           - Repeat colonoscopy is not routinely recommended                            for colon cancer surveillance over age 73.                           - Emerging evidence supports eating a diet of                            fruits, vegetables, grains, calcium, and yogurt                            while reducing red meat and alcohol may reduce the                            risk of colon cancer. Thornton Park MD, MD 03/20/2022 2:13:29 PM This report has been signed electronically.

## 2022-03-20 NOTE — Progress Notes (Signed)
Pt resting comfortably. VSS. Airway intact. SBAR complete to RN. All questions answered.   

## 2022-03-20 NOTE — Progress Notes (Signed)
Indication for EGD: Heartburn, dyspepsia Indication for colonoscopy: History of colon polyps,  3 tubular adenomas removed on colonoscopy in 2020   Please see my 02/21/2022 office note for complete details.  There is been no significant change in history or physical exam since that time.  She remains an appropriate candidate for monitored anesthesia care in the endoscopy center.

## 2022-03-20 NOTE — Patient Instructions (Addendum)
Await pathology results.  Avoid NSAIDS (Aspirin, Ibuprofen, Aleve, Naproxen), you may use Tylenol as needed.  Continue present diet and medications, including famotidine 20 mg twice a day.  Information on polyps and gastritis given to you today.  Routine colon cancer screening is not routinely recommended for colon cancer surveillance over age 73.  YOU HAD AN ENDOSCOPIC PROCEDURE TODAY AT [facility]:   Refer to the procedure report that was given to you for any specific questions about what was found during the examination.  If the procedure report does not answer your questions, please call your gastroenterologist to clarify.  If you requested that your care partner not be given the details of your procedure findings, then the procedure report has been included in a sealed envelope for you to review at your convenience later.  YOU SHOULD EXPECT: Some feelings of bloating in the abdomen. Passage of more gas than usual.  Walking can help get rid of the air that was put into your GI tract during the procedure and reduce the bloating. If you had a lower endoscopy (such as a colonoscopy or flexible sigmoidoscopy) you may notice spotting of blood in your stool or on the toilet paper. If you underwent a bowel prep for your procedure, you may not have a normal bowel movement for a few days.  Please Note:  You might notice some irritation and congestion in your nose or some drainage.  This is from the oxygen used during your procedure.  There is no need for concern and it should clear up in a day or so.  SYMPTOMS TO REPORT IMMEDIATELY:  Following lower endoscopy (colonoscopy or flexible sigmoidoscopy):  Excessive amounts of blood in the stool  Significant tenderness or worsening of abdominal pains  Swelling of the abdomen that is new, acute  Fever of 100F or higher  Following upper endoscopy (EGD)  Vomiting of blood or coffee ground material  New chest pain or pain under the  shoulder blades  Painful or persistently difficult swallowing  New shortness of breath  Fever of 100F or higher  Black, tarry-looking stools  For urgent or emergent issues, a gastroenterologist can be reached at any hour by calling [phone]. Do not use MyChart messaging for urgent concerns.    DIET:  We do recommend a small meal at first, but then you may proceed to your regular diet.  Drink plenty of fluids but you should avoid alcoholic beverages for 24 hours.  ACTIVITY:  You should plan to take it easy for the rest of today and you should NOT DRIVE or use heavy machinery until tomorrow (because of the sedation medicines used during the test).    FOLLOW UP: Our staff will call the number listed on your records the next business day following your procedure.  We will call around 7:15- 8:00 am to check on you and address any questions or concerns that you may have regarding the information given to you following your procedure. If we do not reach you, we will leave a message.     If any biopsies were taken you will be contacted by phone or by letter within the next 1-3 weeks.  Please call us at [phone] if you have not heard about the biopsies in 3 weeks.    SIGNATURES/CONFIDENTIALITY: You and/or your care partner have signed paperwork which will be entered into your electronic medical record.  These signatures attest to the fact that that the information above on your  After Visit Summary has been reviewed and is understood.  Full responsibility of the confidentiality of this discharge information lies with you and/or your care-partner.

## 2022-03-20 NOTE — Progress Notes (Signed)
Called to room to assist during endoscopic procedure.  Patient ID and intended procedure confirmed with present staff. Received instructions for my participation in the procedure from the performing physician.  

## 2022-03-20 NOTE — Op Note (Signed)
[facility] Patient Name: Dawn Underwood Procedure Date: 03/20/2022 1:31 PM MRN: [id number] Endoscopist: Thornton Park MD, MD, [id number] Age: 71 Referring MD:  Date of Birth: 12/01/1950 Gender: Female Account #: [id number] Procedure:                Upper GI endoscopy Indications:              Dyspepsia Medicines:                Monitored Anesthesia Care Procedure:                Pre-Anesthesia Assessment:                           - Prior to the procedure, a History and Physical                            was performed, and patient medications and                            allergies were reviewed. The patient's tolerance of                            previous anesthesia was also reviewed. The risks                            and benefits of the procedure and the sedation                            options and risks were discussed with the patient.                            All questions were answered, and informed consent                            was obtained. Prior Anticoagulants: The patient has                            taken no anticoagulant or antiplatelet agents. ASA                            Grade Assessment: II - A patient with mild systemic                            disease. After reviewing the risks and benefits,                            the patient was deemed in satisfactory condition to                            undergo the procedure.                           After obtaining informed consent, the endoscope was  passed under direct vision. Throughout the                            procedure, the patient's blood pressure, pulse, and                            oxygen saturations were monitored continuously. The                            Endoscope was introduced through the mouth, and                            advanced to the third part of duodenum. The upper                            GI endoscopy was accomplished without  difficulty.                            The patient tolerated the procedure well. Scope In: Scope Out: Findings:                 The examined esophagus was normal. The z-line is                            located 40 cm from the incisors. Biopsies were                            obtained from the mid and distal esophagus with                            cold forceps for histology.                           Patchy mild inflammation characterized by erythema,                            friability and granularity was found in the gastric                            body. Biopsies were taken from the antrum, body,                            and fundus with a cold forceps for histology.                            Estimated blood loss was minimal.                           The examined duodenum was normal. Complications:            No immediate complications. Estimated Blood Loss:     Estimated blood loss was minimal. Impression:               - Normal esophagus.                           -  Gastritis. Biopsied.                           - Normal examined duodenum.                           - Biopsies were taken with a cold forceps for                            evaluation of eosinophilic esophagitis. Recommendation:           - Patient has a contact number available for                            emergencies. The signs and symptoms of potential                            delayed complications were discussed with the                            patient. Return to normal activities tomorrow.                            Written discharge instructions were provided to the                            patient.                           - Resume previous diet.                           - Continue present medications including famotidine                            20 mg BID.                           - Await pathology results.                           - No aspirin, ibuprofen, naproxen, or other                             non-steroidal anti-inflammatory drugs. Thornton Park MD, MD 03/20/2022 1:53:07 PM This report has been signed electronically.

## 2022-03-21 ENCOUNTER — Telehealth: Payer: Self-pay

## 2022-03-21 NOTE — Telephone Encounter (Signed)
  Follow up Call-     03/20/2022   12:48 PM  Call back number  Post procedure Call Back phone  # [phone]  Permission to leave phone message Yes     Patient questions:  Do you have a fever, pain , or abdominal swelling? No. Pain Score  0 *  Have you tolerated food without any problems? Yes.    Have you been able to return to your normal activities? Yes.    Do you have any questions about your discharge instructions: Diet   No. Medications  No. Follow up visit  No.  Do you have questions or concerns about your Care? No.  Actions: * If pain score is 4 or above: No action needed, pain <4.

## 2022-07-21 ENCOUNTER — Other Ambulatory Visit: Payer: Self-pay | Admitting: Family Medicine

## 2022-07-21 DIAGNOSIS — Z1231 Encounter for screening mammogram for malignant neoplasm of breast: Secondary | ICD-10-CM

## 2022-08-03 ENCOUNTER — Other Ambulatory Visit: Payer: Self-pay | Admitting: Internal Medicine

## 2022-08-03 DIAGNOSIS — R002 Palpitations: Secondary | ICD-10-CM

## 2022-08-19 ENCOUNTER — Other Ambulatory Visit: Payer: Self-pay | Admitting: Gastroenterology

## 2022-09-04 ENCOUNTER — Ambulatory Visit
Admission: RE | Admit: 2022-09-04 | Discharge: 2022-09-04 | Disposition: A | Discharge status: Home / Self Care | Payer: Medicare Other | Source: Ambulatory Visit | Attending: Family Medicine | Admitting: Family Medicine

## 2022-09-04 DIAGNOSIS — Z1231 Encounter for screening mammogram for malignant neoplasm of breast: Secondary | ICD-10-CM

## 2022-10-22 ENCOUNTER — Other Ambulatory Visit: Payer: Self-pay | Admitting: Family Medicine

## 2022-10-22 DIAGNOSIS — M8580 Other specified disorders of bone density and structure, unspecified site: Secondary | ICD-10-CM

## 2023-03-05 ENCOUNTER — Encounter: Payer: Self-pay | Admitting: Internal Medicine

## 2023-03-05 ENCOUNTER — Ambulatory Visit: Payer: Medicare Other | Attending: Internal Medicine | Admitting: Internal Medicine

## 2023-03-05 VITALS — BP 114/76 | HR 54 | Ht 67.5 in | Wt 171.4 lb

## 2023-03-05 DIAGNOSIS — I10 Essential (primary) hypertension: Secondary | ICD-10-CM | POA: Diagnosis not present

## 2023-03-05 NOTE — Progress Notes (Signed)
OFFICE NOTE  Chief Complaint:  Follow-up  Primary Care Physician: Richmond Campbell., PA-C  HPI:  Dawn Underwood is a 72 y.o. female who is currently referred to me for evaluation of palpitations. She reported that she's had palpitations on and off for years however recently had worsening symptoms after a bout of colitis with bright red blood per rectum. This ultimately resolved. She then had problems regulating her thyroid was referred to Dr. Debara Pickett for further evaluation. She does have a history of graves disease. There is also underlying anxiety and of course palpitations in the past. Family history significant for some coronary disease in her grandparents and she has dyslipidemia and questionable hypertension (she's had both hypotension and hypertension at times, and may have white coat hypertension). In February she was taken the emergency department after an episode where she fell of flushing feeling over her body as well as her heart pounding out of her chest. She became very anxious about this and resented to the emergency department. She said she waited in the ER for several hours and her symptoms resolved prior to being completely evaluated. Ultimately she was told that this was a "panic attack". Since then she's had no other significant episodes but several other smaller episodes of palpitations. She also reports some left shoulder pain which is sharp and may be related to these palpitations. In addition there is some mild shortness of breath.  10/02/15  Dawn Underwood returns today for follow-up of her studies. She underwent an exercise treadmill stress test which was low risk for ischemia. She had an echocardiogram which showed EF 55-60% with normal systolic function, mild diastolic dysfunction trivial MR and mild TR. She did wear a monitor which demonstrated PVCs, bigeminal PVCs and PACs, likely the cause of her palpitations. She reports that they were particularly bothersome this  weekend however she was under significant stress and anxiety related to following around her grandchildren over the holiday.  10/29/2015  Dawn Underwood was seen back today in follow-up. She says her palpitations have nearly resolved on propranolol. She also thinks she's had improvement in her anxiety. She's not had take any additional Xanax since we last saw her month ago.  05/30/2016  Dawn Underwood returns today for follow-up. She reports a recently she's been having some more palpitations. In general are not nearly as bad as before she started propranolol but have become a little worse. She is struggling with some pain in her feet and has a heel spur. She says is difficult for her to walk and has been more anxious because of this. She is using Xanax but sparingly. In general that seems to help with her palpitations as well.  06/01/2017  Dawn Underwood was seen today in annual follow-up.  She reports her palpitations have been very well controlled.  Blood pressure is at goal.  She exercises 3-5 days a week without any limitations.  She reports 2 episodes over the past year of feeling of hot and flushed with some associated vomiting or diarrhea.  She is also had some morning vomiting after taking her medications and drinking a cup of coffee.  She typically does not have any food on her stomach that time.  It was thought that these might be hot flashes however she does not feel like there typically associated with that.  To me these episodes sound like reflux.  We talked about ways to try to reduce reflux and that she could consider over-the-counter H2 blockers or  PPIs.  05/26/2018  Dawn Underwood is seen today in annual follow-up.  She is done well with regards her palpitations over the past year.  She continues to exercise at least 3 times a week.  She was exercising more but developed hip problems on the elliptical machine and her orthopedist told her to back off.  She did have some lab work recently  which showed total cholesterol of 157, triglycerides 95, HDL 58 and LDL 82.  She is on low-dose simvastatin.  She takes propranolol primarily for palpitations and has not had an issue with blood pressure.  05/30/2019  Dawn Underwood is seen today in follow-up.  She has very infrequent palpitations.  She continues to walk outside.  Unfortunate she is not been able to go to the gym due to Covid.  She denies any chest pain.  Overall she is pleased with her palpitation control on propranolol.  06/04/2020  Dawn Underwood returns today for follow-up.  More recently she has had episodes of tightness across her chest.  She describes it more as stiffness of her muscles.  She says it comes on and does not feel like squeezing, pressure or heaviness, she denies any tachycardia or palpitations but then when the episode lets off she says she feels very fatigued.  She says she has had about 15 of these episodes in the past few weeks including 2 episodes yesterday.  Initially she thought it was due to sertraline which she was started on for depression/anxiety by her PCP however that was discontinued and her symptoms have persisted.  There is some mild shortness of breath particular walking upstairs but it does not cause her to need to stop.  07/30/2020  [name] is seen today in follow-up.  She reports no further episodes of the chest wall stiffening which she described but she said she had another episode which felt more like squeezing as she clenched her fist and held it over her sternum.  This sounded a little more like angina.  It is very difficult to tell.  She did undergo monitoring which showed some brief episodes of SVT as well as NSVT which was slow.  Based on this, I recommended increasing her propranolol.  She says that she feels cooler with this and is concerned about weight gain which are side effect she said she read about, but she also says she feels more calm on the medicine and after some discussion says  she would rather remain on it.  She does not seem to be symptomatic with palpitations.  I am concerned however with the VT and the chest pain symptoms that there could be underlying ischemia.  11/07/2020  Mrs. Gardenhire returns today for follow-up.  She underwent stress testing in April which was negative for ischemia and showed normal LV function.  Overall she feels improved.  She is now taking propranolol at night and has less palpitations.  Blood pressure is well controlled today.  12/19/2021  Dawn Underwood is seen today in follow-up.  She reports good control of her palpitations on propranolol.  Blood pressure is excellent today 112/78.  EKG shows a sinus bradycardia at 59.  She had recent labs which show good cholesterol control on lower dose simvastatin.  She has stopped taking aspirin.  She was concerned about easy bruising.  She has had some fatigue although is under a lot of stress.  Her husband has been diagnosed with Parkinson's in her daughter has aplastic anemia.  03/05/2023  Dawn Underwood is  today for follow-up.  Overall she seems to be doing pretty well.  Blood pressure is well-controlled today.  She denies any palpitations of significance and says she has good control on propranolol.  PMHx:  Past Medical History:  Diagnosis Date   Acute sinusitis, unspecified    Anemia    Cystocele or rectocele with complete uterine prolapse    Graves' disease    Hyperlipidemia    Joint pain    Light headedness    Lower back pain    Other malaise and fatigue     Past Surgical History:  Procedure Laterality Date   ABDOMINAL HYSTERECTOMY     bladder mesh     BREAST BIOPSY Left    CATARACT EXTRACTION     Bil   TUBAL LIGATION      FAMHx:  Family History  Problem Relation Age of Onset   Diabetes Father    Colon cancer Neg Hx    Esophageal cancer Neg Hx    Rectal cancer Neg Hx    Stomach cancer Neg Hx     SOCHx:   reports that she quit smoking about 50 years ago. Her smoking use  included cigarettes. She has never used smokeless tobacco. She reports current alcohol use of about 4.0 standard drinks of alcohol per week. She reports that she does not use drugs.  ALLERGIES:  Allergies  Allergen Reactions   Erythromycin Other (See Comments)    Pt doesn't remember    ROS: Pertinent items noted in HPI and remainder of comprehensive ROS otherwise negative.  HOME MEDS: Current Outpatient Medications  Medication Sig Dispense Refill   calcium citrate-vitamin D (CITRACAL+D) 315-200 MG-UNIT tablet Take 1 tablet by mouth 2 (two) times daily. Take 1 tab by mouth in the morning and tabs in the evening     Cholecalciferol (VITAMIN D3) 50 MCG (2000 UT) TABS Take 1 tablet by mouth daily. Patient takes 5000 units daily     Cyanocobalamin (B-12) 2500 MCG SUBL Place 1 each under the tongue daily.     famotidine (PEPCID) 20 MG tablet TAKE 1 TABLET BY MOUTH TWICE A DAY AS NEEDED FOR HEARTBURN OR INDIGESTION 180 tablet 1   Fish Oil-Krill Oil (KRILL OIL PLUS PO) Take by mouth.     Glucosamine-Chondroit-Vit C-Mn (GLUCOSAMINE 1500 COMPLEX PO) Take 1 tablet by mouth 2 (two) times daily.     propranolol ER (INDERAL LA) 120 MG 24 hr capsule TAKE ONE CAPSULE BY MOUTH DAILY 90 capsule 3   sertraline (ZOLOFT) 50 MG tablet Take 50 mg by mouth daily.     simvastatin (ZOCOR) 10 MG tablet Take 10 mg by mouth at bedtime.     Coenzyme Q10 100 MG TABS Take by mouth daily.     Omega 3-6-9 Fatty Acids (OMEGA 3-6-9 COMPLEX PO) Take by mouth. (Patient not taking: Reported on 11/07/2020)     No current facility-administered medications for this visit.    LABS/IMAGING: No results found for this or any previous visit (from the past 48 hour(s)). No results found.  WEIGHTS: Wt Readings from Last 3 Encounters:  03/05/23 171 lb 6.4 oz (77.7 kg)  03/20/22 174 lb (78.9 kg)  02/21/22 174 lb (78.9 kg)    VITALS: BP 114/76 (BP Location: Left Arm, Patient Position: Sitting, Cuff Size: Large)   Pulse (!) 54    Ht 5' 7.5" (1.715 m)   Wt 171 lb 6.4 oz (77.7 kg)   LMP  (LMP Unknown)   SpO2 96%  BMI 26.45 kg/m   EXAM: General appearance: alert and no distress Neck: no carotid bruit, no JVD, and thyroid not enlarged, symmetric, no tenderness/mass/nodules Lungs: clear to auscultation bilaterally Heart: regular rate and rhythm, S1, S2 normal, no murmur, click, rub or gallop Abdomen: soft, non-tender; bowel sounds normal; no masses,  no organomegaly Extremities: varicose veins noted Pulses: 2+ and symmetric Skin: Skin color, texture, turgor normal. 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   Mrs. Burgener is doing well without any significant palpitations.  Blood pressure is well-controlled.  She denies chest pain or shortness of breath.  Would not advise any med changes today.  Plan follow-up with me annually or sooner as necessary.  Chrystie Nose, MD, [facility], FACP  [place]  [facility]  Medical Director of the Advanced Lipid Disorders &  Cardiovascular Risk Reduction Clinic Diplomate of the American Board of Clinical Lipidology Attending Cardiologist  Direct Dial: [phone]  Fax: [phone]  Website:  [email or web address] Lohmann 03/05/2023, 4:04 PM

## 2023-03-05 NOTE — Patient Instructions (Signed)
Medication Instructions:  No change  *If you need a refill on your cardiac medications before your next appointment, please call your pharmacy*   Lab Work: None    Testing/Procedures: None    Follow-Up: At [facility], you and your health needs are our priority.  As part of our continuing mission to provide you with exceptional heart care, we have created designated Provider Care Teams.  These Care Teams include your primary Cardiologist (physician) and Advanced Practice Providers (APPs -  Physician Assistants and Nurse Practitioners) who all work together to provide you with the care you need, when you need it.    Your next appointment:   1 year(s)  Provider:   Chrystie Nose, MD

## 2023-05-12 ENCOUNTER — Ambulatory Visit
Admission: RE | Admit: 2023-05-12 | Discharge: 2023-05-12 | Disposition: A | Discharge status: Home / Self Care | Payer: Medicare Other | Source: Ambulatory Visit | Attending: Family Medicine | Admitting: Family Medicine

## 2023-05-12 DIAGNOSIS — M8580 Other specified disorders of bone density and structure, unspecified site: Secondary | ICD-10-CM

## 2023-07-02 ENCOUNTER — Telehealth: Payer: Self-pay | Admitting: Internal Medicine

## 2023-07-02 DIAGNOSIS — E785 Hyperlipidemia, unspecified: Secondary | ICD-10-CM

## 2023-07-02 NOTE — Telephone Encounter (Signed)
 Chrystie Nose, MD  You1 minute ago (5:09 PM)    Thanks .Marland Kitchen She's already on a statin - but her LDL should be lower, less than 70 - last labs were last year - so please order an updated fasting lipid profile - she may need additional therapy.    Dr H   Patient identification verified by 2 forms. Marilynn Rail, RN    Called and spoke to patient  Relayed provider message below  Informed patient lab order placed, complete while fasting  Patient verbalized understanding, no questions at this time

## 2023-07-02 NOTE — Telephone Encounter (Signed)
 Patient stated she had an x-ray done at [facility] and they reported she had plaque in her arteries.  Patient wants Dr. Rennis Golden to review those results and advise on next steps.

## 2023-07-02 NOTE — Telephone Encounter (Signed)
 FWD to Dr. Rennis Golden for input   Xray results:

## 2023-07-09 LAB — LIPID PANEL
Chol/HDL Ratio: 3.1 ratio (ref 0.0–4.4)
Cholesterol, Total: 205 mg/dL — ABNORMAL HIGH (ref 100–199)
HDL: 66 mg/dL (ref >39)
LDL Chol Calc (NIH): 119 mg/dL — ABNORMAL HIGH (ref 0–99)
Triglycerides: 113 mg/dL (ref 0–149)
VLDL Cholesterol Cal: 20 mg/dL (ref 5–40)

## 2023-07-14 ENCOUNTER — Other Ambulatory Visit: Payer: Self-pay | Admitting: *Deleted

## 2023-07-14 ENCOUNTER — Encounter: Payer: Self-pay | Admitting: Internal Medicine

## 2023-07-14 DIAGNOSIS — E785 Hyperlipidemia, unspecified: Secondary | ICD-10-CM

## 2023-07-14 MED ORDER — EZETIMIBE 10 MG PO TABS: 10.0000 mg | ORAL_TABLET | Freq: Every day | ORAL | 3 refills | Status: DC

## 2023-07-29 ENCOUNTER — Telehealth: Payer: Self-pay | Admitting: Internal Medicine

## 2023-07-29 NOTE — Telephone Encounter (Signed)
 Pt c/o medication issue:  1. Name of Medication: ezetimibe (ZETIA) 10 MG tablet   2. How are you currently taking this medication (dosage and times per day)?    3. Are you having a reaction (difficulty breathing--STAT)? no  4. What is your medication issue? Patient states that she is unable to take the medication, she doesn't like the way it make her feel. Please advise

## 2023-07-29 NOTE — Telephone Encounter (Signed)
 Spoke to patient she stated she is unable to take Zetia causing muscle and joint pain.I will make Dr.Hilty aware.

## 2023-07-30 ENCOUNTER — Other Ambulatory Visit: Payer: Self-pay | Admitting: Internal Medicine

## 2023-07-30 DIAGNOSIS — R002 Palpitations: Secondary | ICD-10-CM

## 2023-08-04 ENCOUNTER — Other Ambulatory Visit: Payer: Self-pay | Admitting: Family Medicine

## 2023-08-04 DIAGNOSIS — Z0000 Encounter for general adult medical examination without abnormal findings: Secondary | ICD-10-CM

## 2023-08-19 ENCOUNTER — Other Ambulatory Visit: Payer: Self-pay | Admitting: Family Medicine

## 2023-08-19 DIAGNOSIS — Z9189 Other specified personal risk factors, not elsewhere classified: Secondary | ICD-10-CM

## 2023-08-19 DIAGNOSIS — R5383 Other fatigue: Secondary | ICD-10-CM

## 2023-08-19 DIAGNOSIS — R002 Palpitations: Secondary | ICD-10-CM

## 2023-08-19 DIAGNOSIS — I700 Atherosclerosis of aorta: Secondary | ICD-10-CM

## 2023-08-24 ENCOUNTER — Ambulatory Visit
Admission: RE | Admit: 2023-08-24 | Discharge: 2023-08-24 | Discharge status: Home / Self Care | Source: Ambulatory Visit | Attending: Family Medicine | Admitting: Family Medicine

## 2023-08-24 DIAGNOSIS — Z9189 Other specified personal risk factors, not elsewhere classified: Secondary | ICD-10-CM

## 2023-08-24 DIAGNOSIS — R5383 Other fatigue: Secondary | ICD-10-CM

## 2023-08-24 DIAGNOSIS — R002 Palpitations: Secondary | ICD-10-CM

## 2023-08-24 DIAGNOSIS — I700 Atherosclerosis of aorta: Secondary | ICD-10-CM

## 2023-08-31 NOTE — Progress Notes (Unsigned)
 Cardiology Office Note    Patient Name: Dawn Underwood Date of Encounter: 08/31/2023  Primary Care Provider:  Lory Rough., PA-C Primary Cardiologist:  Hazle Lites, MD Primary Electrophysiologist: None   Past Medical History    Past Medical History:  Diagnosis Date   Acute sinusitis, unspecified    Anemia    Cystocele or rectocele with complete uterine prolapse    Graves' disease    Hyperlipidemia    Joint pain    Light headedness    Lower back pain    Other malaise and fatigue     History of Present Illness  Dawn Underwood is a 73 y.o. female with a PMH of aortic atherosclerosis, palpitations, anemia, Graves' disease secondary to hypothyroidism, HLD, anxiety who presents today for follow-up of sinus bradycardia.  Dawn Underwood has been followed by Dr. Maximo Spar since 2017 for evaluation of palpitations.  She endorsed palpitations on and off for years with a history of anxiety and Graves' disease.  She underwent a GXT to rule out ischemia that was low risk.  She had a 2D echo completed that showed EF of 55 to 60% with no RWMA and mild diastolic dysfunction with mild MR/TR.  She also wore an event monitor that showed PVCs with bigeminal and PACs.  She was switched to propranolol  with decrease in palpitations.  She was seen on 06/04/2020 with complaint of tightness across her chest.  She described it as coming and going with no association of squeezing.  She underwent a Myoview  to rule out possible ischemia that was normal and low risk.  She was seen on 12/19/2021 and EKG shows sinus bradycardia with rate of 59.  She was last seen by Dr. Maximo Spar on 03/05/2023 and reported doing well overall with well-controlled BP.  She denied any significant palpitations and was tolerating propranolol  well.  [facility] and was started on ezetimibe  but developed muscle and joint pain.  Dawn Underwood presents today with her husband  for follow-up and complaint of fatigue and shortness of breath. She has experienced increased fatigue, feeling as tired upon waking as when she went to bed, and requiring frequent naps. She also noted heart fluttering and occasional palpitations, dizziness when bending over or standing, and increased anxiety. She attributes some of her stress to family issues, including her daughter's aplastic anemia and her grandson's autism. She is on propranolol  120 mg and Zoloft for anxiety. She attempted ezetimibe  for coronary calcium but discontinued it due to joint pain and muscle aches. She has been walking regularly, including using a treadmill at the gym. No significant weight loss but notes fluctuations. She experiences shortness of breath and fatigue during physical activity, such as walking and climbing stairs, and sometimes feels weak. She feels 'real nervous on the inside' and has been on Zoloft for about two and a half years. She expresses concern about the potential for dementia or Alzheimer's related to her medication use. Patient denies chest pain, palpitations, dyspnea, PND, orthopnea, nausea, vomiting, dizziness, syncope, edema, weight gain, or early satiety.  Discussed the use of AI scribe software for clinical note transcription with the patient, who gave verbal consent to proceed.  History of Present Illness    Review of Systems  Please see the history of present illness.    All other systems reviewed and are otherwise negative except as noted above.  Physical Exam    Wt Readings from Last 3  Encounters:  03/05/23 171 lb 6.4 oz (77.7 kg)  03/20/22 174 lb (78.9 kg)  02/21/22 174 lb (78.9 kg)   ZO:[id number] were no vitals filed for this visit.,There is no height or weight on file to calculate BMI. GEN: Well nourished, well developed in no acute distress Neck: No JVD; No carotid bruits Pulmonary: Clear to auscultation without rales, wheezing or rhonchi  Cardiovascular: Normal rate. Regular  rhythm. Normal S1. Normal S2.   Murmurs: There is no murmur.  ABDOMEN: Soft, non-tender, non-distended EXTREMITIES:  No edema; No deformity   EKG/LABS/ Recent Cardiac Studies   ECG personally reviewed by me today -none completed today  Risk Assessment/Calculations:     Lab Results  Component Value Date   WBC 5.4 02/01/2019   HGB 12.9 02/01/2019   HCT 40.4 02/01/2019   MCV 89.6 02/01/2019   PLT 229 02/01/2019   Lab Results  Component Value Date   CREATININE 0.70 02/01/2019   BUN 14 02/01/2019   NA 140 02/01/2019   K 3.8 02/01/2019   CL 107 02/01/2019   CO2 24 02/01/2019   Lab Results  Component Value Date   CHOL 205 (H) 07/09/2023   HDL 66 07/09/2023   LDLCALC 119 (H) 07/09/2023   TRIG 113 07/09/2023   CHOLHDL 3.1 07/09/2023    No results found for: "HGBA1C" Assessment & Plan    1.  Sinus bradycardia: - Reduce propranolol  dosage from 120 mg to 80 mg daily and monitor symptoms. - Order echocardiogram to assess heart structure and function.  2.  Palpitations: - Patient is currently on propranolol  120 mg daily - Reduce propranolol  dosage from 120 mg to 80 mg daily and monitor symptoms.  3.  Hyperlipidemia: - Patient's last LDL cholesterol was 119 -Hyperlipidemia managed with simvastatin, previous ezetimibe  intolerance. - Increase simvastatin dosage to 20 mg daily and recheck cholesterol in 8 weeks. - Resume baby aspirin to reduce cardiovascular risk.  4.  History of anxiety: -Increased anxiety and possible depression, currently on Zoloft. - Discuss with primary care provider about potential adjustment of antidepressant medication.  5.  Fatigue: -Fatigue potentially due to propranolol , situational stress, or depression. - Reduce propranolol  dosage from 120 mg to 80 mg daily and monitor symptoms. - Discuss with primary care provider about potential adjustment of antidepressant medication.    Disposition: Follow-up with Hazle Lites, MD or APP in 3 months     Signed, Francene Ing, Retha Cast, NP 08/31/2023, 12:05 PM [facility]

## 2023-09-01 ENCOUNTER — Ambulatory Visit: Attending: Nurse Practitioner | Admitting: Nurse Practitioner

## 2023-09-01 ENCOUNTER — Encounter: Payer: Self-pay | Admitting: Nurse Practitioner

## 2023-09-01 VITALS — BP 116/74 | HR 67 | Ht 67.5 in | Wt 173.8 lb

## 2023-09-01 DIAGNOSIS — I10 Essential (primary) hypertension: Secondary | ICD-10-CM

## 2023-09-01 DIAGNOSIS — R002 Palpitations: Secondary | ICD-10-CM

## 2023-09-01 DIAGNOSIS — R001 Bradycardia, unspecified: Secondary | ICD-10-CM | POA: Diagnosis not present

## 2023-09-01 DIAGNOSIS — I700 Atherosclerosis of aorta: Secondary | ICD-10-CM

## 2023-09-01 DIAGNOSIS — R5383 Other fatigue: Secondary | ICD-10-CM

## 2023-09-01 DIAGNOSIS — E785 Hyperlipidemia, unspecified: Secondary | ICD-10-CM

## 2023-09-01 MED ORDER — PROPRANOLOL HCL ER 80 MG PO CP24: 80.0000 mg | ORAL_CAPSULE | Freq: Every day | ORAL | 0 refills | Status: DC

## 2023-09-01 MED ORDER — ASPIRIN 81 MG PO TBEC: 81.0000 mg | DELAYED_RELEASE_TABLET | Freq: Every day | ORAL | Status: AC

## 2023-09-01 MED ORDER — ROSUVASTATIN CALCIUM 20 MG PO TABS: 20.0000 mg | ORAL_TABLET | Freq: Every day | ORAL | 1 refills | Status: DC

## 2023-09-01 NOTE — Patient Instructions (Addendum)
 Medication Instructions:  DECREASE Propranolol  to 80mg  Take 1 tablet once a day  START Aspirin 81mg  Take 1 tablet daily  INCREASE Crestor to 20mg  Take 1 tablet once a day  *If you need a refill on your cardiac medications before your next appointment, please call your pharmacy*  Lab Work: FASTING LABS IN 8 WEEK-LIPIDS & LFTS (10/27/2023) If you have labs (blood work) drawn today and your tests are completely normal, you will receive your results only by: MyChart Message (if you have MyChart) OR A paper copy in the mail If you have any lab test that is abnormal or we need to change your treatment, we will call you to review the results.  Testing/Procedures: Your physician has requested that you have an echocardiogram. Echocardiography is a painless test that uses sound waves to create images of your heart. It provides your doctor with information about the size and shape of your heart and how well your heart's chambers and valves are working. This procedure takes approximately one hour. There are no restrictions for this procedure. Please do NOT wear cologne, perfume, aftershave, or lotions (deodorant is allowed). Please arrive 15 minutes prior to your appointment time.  Please note: We ask at that you not bring children with you during ultrasound (echo/ vascular) testing. Due to room size and safety concerns, children are not allowed in the ultrasound rooms during exams. Our front office staff cannot provide observation of children in our lobby area while testing is being conducted. An adult accompanying a patient to their appointment will only be allowed in the ultrasound room at the discretion of the ultrasound technician under special circumstances. We apologize for any inconvenience.  Follow-Up: At [facility], you and your health needs are our priority.  As part of our continuing mission to provide you with exceptional heart care, our providers are all part of one team.  This team  includes your primary Cardiologist (physician) and Advanced Practice Providers or APPs (Physician Assistants and Nurse Practitioners) who all work together to provide you with the care you need, when you need it.  Your next appointment:   3 month(s)  Provider:   Charles Connor, NP        We recommend signing up for the patient portal called "MyChart".  Sign up information is provided on this After Visit Summary.  MyChart is used to connect with patients for Virtual Visits (Telemedicine).  Patients are able to view lab/test results, encounter notes, upcoming appointments, etc.  Non-urgent messages can be sent to your provider as well.   To learn more about what you can do with MyChart, go to [email or web address].   Other Instructions CALL IN 2-3 WEEKS WITH AN UPDATE OF HOW YOU ARE FEELING

## 2023-09-02 ENCOUNTER — Other Ambulatory Visit: Payer: Self-pay

## 2023-09-02 DIAGNOSIS — E785 Hyperlipidemia, unspecified: Secondary | ICD-10-CM

## 2023-09-02 DIAGNOSIS — R002 Palpitations: Secondary | ICD-10-CM

## 2023-09-02 DIAGNOSIS — I10 Essential (primary) hypertension: Secondary | ICD-10-CM

## 2023-09-03 ENCOUNTER — Other Ambulatory Visit: Payer: Self-pay | Admitting: *Deleted

## 2023-09-03 DIAGNOSIS — R002 Palpitations: Secondary | ICD-10-CM

## 2023-09-03 DIAGNOSIS — I10 Essential (primary) hypertension: Secondary | ICD-10-CM

## 2023-09-03 DIAGNOSIS — E785 Hyperlipidemia, unspecified: Secondary | ICD-10-CM

## 2023-09-03 DIAGNOSIS — R5383 Other fatigue: Secondary | ICD-10-CM

## 2023-09-07 ENCOUNTER — Ambulatory Visit
Admission: RE | Admit: 2023-09-07 | Discharge: 2023-09-07 | Disposition: A | Discharge status: Home / Self Care | Source: Ambulatory Visit | Attending: Family Medicine | Admitting: Family Medicine

## 2023-09-07 DIAGNOSIS — Z0000 Encounter for general adult medical examination without abnormal findings: Secondary | ICD-10-CM

## 2023-09-23 ENCOUNTER — Telehealth: Payer: Self-pay | Admitting: Internal Medicine

## 2023-09-23 DIAGNOSIS — R002 Palpitations: Secondary | ICD-10-CM

## 2023-09-23 MED ORDER — PROPRANOLOL HCL ER 80 MG PO CP24: 80.0000 mg | ORAL_CAPSULE | Freq: Every day | ORAL | 3 refills | Status: AC

## 2023-09-23 NOTE — Telephone Encounter (Signed)
 *  STAT* If patient is at the pharmacy, call can be transferred to refill team.   1. Which medications need to be refilled? (please list name of each medication and dose if known)   propranolol  ER (INDERAL  LA) 80 MG 24 hr capsule     2. Would you like to learn more about the convenience, safety, & potential cost savings by using the [facility]? No      3. Are you open to using the [facility] (Type [facility]. ). No    4. Which pharmacy/location (including street and city if local pharmacy) is medication to be sent to? [id number] - [place], [place] - [address]     5. Do they need a 30 day or 90 day supply? 90 days

## 2023-09-23 NOTE — Telephone Encounter (Signed)
 Pt's medication was sent to pt's pharmacy as requested. Confirmation received.

## 2023-10-07 ENCOUNTER — Ambulatory Visit (HOSPITAL_COMMUNITY)
Admission: RE | Admit: 2023-10-07 | Discharge: 2023-10-07 | Disposition: A | Discharge status: Home / Self Care | Source: Ambulatory Visit | Attending: Cardiology | Admitting: Cardiology

## 2023-10-07 ENCOUNTER — Ambulatory Visit: Payer: Self-pay | Admitting: Nurse Practitioner

## 2023-10-07 DIAGNOSIS — E785 Hyperlipidemia, unspecified: Secondary | ICD-10-CM | POA: Insufficient documentation

## 2023-10-07 DIAGNOSIS — R002 Palpitations: Secondary | ICD-10-CM | POA: Diagnosis present

## 2023-10-07 DIAGNOSIS — I10 Essential (primary) hypertension: Secondary | ICD-10-CM | POA: Insufficient documentation

## 2023-10-07 DIAGNOSIS — R0602 Shortness of breath: Secondary | ICD-10-CM | POA: Diagnosis not present

## 2023-10-07 LAB — ECHOCARDIOGRAM COMPLETE
Area-P 1/2: 3.65 cm2
S' Lateral: 2.8 cm

## 2023-10-07 MED ORDER — FUROSEMIDE 20 MG PO TABS: 10.0000 mg | ORAL_TABLET | Freq: Every day | ORAL | 1 refills | Status: DC | PRN

## 2023-10-21 LAB — LAB REPORT - SCANNED: EGFR: 62

## 2023-10-22 ENCOUNTER — Ambulatory Visit: Payer: Self-pay | Admitting: Nurse Practitioner

## 2023-11-23 ENCOUNTER — Ambulatory Visit: Admitting: Nurse Practitioner

## 2023-11-24 ENCOUNTER — Ambulatory Visit: Attending: Internal Medicine | Admitting: Internal Medicine

## 2023-11-24 VITALS — BP 126/68 | HR 56 | Ht 66.0 in | Wt 170.4 lb

## 2023-11-24 DIAGNOSIS — I2510 Atherosclerotic heart disease of native coronary artery without angina pectoris: Secondary | ICD-10-CM | POA: Diagnosis not present

## 2023-11-24 DIAGNOSIS — R001 Bradycardia, unspecified: Secondary | ICD-10-CM | POA: Diagnosis not present

## 2023-11-24 DIAGNOSIS — R002 Palpitations: Secondary | ICD-10-CM

## 2023-11-24 DIAGNOSIS — E785 Hyperlipidemia, unspecified: Secondary | ICD-10-CM | POA: Diagnosis not present

## 2023-11-24 NOTE — Progress Notes (Signed)
 OFFICE NOTE  Chief Complaint:  Follow-up  Primary Care Physician: Debrah Josette ORN., PA-C  HPI:  Dawn Underwood is a 73 y.o. female who is currently referred to me for evaluation of palpitations. She reported that she's had palpitations on and off for years however recently had worsening symptoms after a bout of colitis with bright red blood per rectum. This ultimately resolved. She then had problems regulating her thyroid  was referred to Dr. Chyrl Alexander for further evaluation. She does have a history of graves disease. There is also underlying anxiety and of course palpitations in the past. Family history significant for some coronary disease in her grandparents and she has dyslipidemia and questionable hypertension (she's had both hypotension and hypertension at times, and may have white coat hypertension). In February she was taken the emergency department after an episode where she fell of flushing feeling over her body as well as her heart pounding out of her chest. She became very anxious about this and resented to the emergency department. She said she waited in the ER for several hours and her symptoms resolved prior to being completely evaluated. Ultimately she was told that this was a panic attack. Since then she's had no other significant episodes but several other smaller episodes of palpitations. She also reports some left shoulder pain which is sharp and may be related to these palpitations. In addition there is some mild shortness of breath.  10/02/15  Mrs. Trautmann returns today for follow-up of her studies. She underwent an exercise treadmill stress test which was low risk for ischemia. She had an echocardiogram which showed EF 55-60% with normal systolic function, mild diastolic dysfunction trivial MR and mild TR. She did wear a monitor which demonstrated PVCs, bigeminal PVCs and PACs, likely the cause of her palpitations. She reports that they were particularly bothersome this  weekend however she was under significant stress and anxiety related to following around her grandchildren over the holiday.  10/29/2015  Mrs. Heinrich was seen back today in follow-up. She says her palpitations have nearly resolved on propranolol . She also thinks she's had improvement in her anxiety. She's not had take any additional Xanax since we last saw her month ago.  05/30/2016  Mrs. Riedesel returns today for follow-up. She reports a recently she's been having some more palpitations. In general are not nearly as bad as before she started propranolol  but have become a little worse. She is struggling with some pain in her feet and has a heel spur. She says is difficult for her to walk and has been more anxious because of this. She is using Xanax but sparingly. In general that seems to help with her palpitations as well.  06/01/2017  Mrs. Pisarski was seen today in annual follow-up.  She reports her palpitations have been very well controlled.  Blood pressure is at goal.  She exercises 3-5 days a week without any limitations.  She reports 2 episodes over the past year of feeling of hot and flushed with some associated vomiting or diarrhea.  She is also had some morning vomiting after taking her medications and drinking a cup of coffee.  She typically does not have any food on her stomach that time.  It was thought that these might be hot flashes however she does not feel like there typically associated with that.  To me these episodes sound like reflux.  We talked about ways to try to reduce reflux and that she could consider over-the-counter H2 blockers or  PPIs.  05/26/2018  [name] is seen today in annual follow-up.  She is done well with regards her palpitations over the past year.  She continues to exercise at least 3 times a week.  She was exercising more but developed hip problems on the elliptical machine and her orthopedist told her to back off.  She did have some lab work recently which  showed total cholesterol of 157, triglycerides 95, HDL 58 and LDL 82.  She is on low-dose simvastatin.  She takes propranolol  primarily for palpitations and has not had an issue with blood pressure.  05/30/2019  Mrs. Chow is seen today in follow-up.  She has very infrequent palpitations.  She continues to walk outside.  Unfortunate she is not been able to go to the gym due to Covid.  She denies any chest pain.  Overall she is pleased with her palpitation control on propranolol .  06/04/2020  Mrs. Mccree returns today for follow-up.  More recently she has had episodes of tightness across her chest.  She describes it more as stiffness of her muscles.  She says it comes on and does not feel like squeezing, pressure or heaviness, she denies any tachycardia or palpitations but then when the episode lets off she says she feels very fatigued.  She says she has had about 15 of these episodes in the past few weeks including 2 episodes yesterday.  Initially she thought it was due to sertraline which she was started on for depression/anxiety by her PCP however that was discontinued and her symptoms have persisted.  There is some mild shortness of breath particular walking upstairs but it does not cause her to need to stop.  07/30/2020  Mrs. Que is seen today in follow-up.  She reports no further episodes of the chest wall stiffening which she described but she said she had another episode which felt more like squeezing as she clenched her fist and held it over her sternum.  This sounded a little more like angina.  It is very difficult to tell.  She did undergo monitoring which showed some brief episodes of SVT as well as NSVT which was slow.  Based on this, I recommended increasing her propranolol .  She says that she feels cooler with this and is concerned about weight gain which are side effect she said she read about, but she also says she feels more calm on the medicine and after some discussion says she  would rather remain on it.  She does not seem to be symptomatic with palpitations.  I am concerned however with the VT and the chest pain symptoms that there could be underlying ischemia.  11/07/2020  Mrs. Shaneyfelt returns today for follow-up.  She underwent stress testing in April which was negative for ischemia and showed normal LV function.  Overall she feels improved.  She is now taking propranolol  at night and has less palpitations.  Blood pressure is well controlled today.  12/19/2021  Mrs. Polinski is seen today in follow-up.  She reports good control of her palpitations on propranolol .  Blood pressure is excellent today 112/78.  EKG shows a sinus bradycardia at 59.  She had recent labs which show good cholesterol control on lower dose simvastatin.  She has stopped taking aspirin .  She was concerned about easy bruising.  She has had some fatigue although is under a lot of stress.  Her husband has been diagnosed with Parkinson's in her daughter has aplastic anemia.  03/05/2023  Mrs. Haskell is  today for follow-up.  Overall she seems to be doing pretty well.  Blood pressure is well-controlled today.  She denies any palpitations of significance and says she has good control on propranolol .  11/24/2023  Mrs. Lina returns today for follow-up.  She last saw Jackee Alberts, NP for fatigue and bradycardia.  Her propranolol  was decreased from 120 to 80 mg daily.  She had a repeat echo which showed normal systolic function.  She had a recent calcium  score in April which showed a calcium  score of 59.9, 55th percentile for age and sex matched controls.  Subsequently her statins have been adjusted including switching to rosuvastatin  20 mg daily which she is tolerating.  Lipid profile in April showed total cholesterol 151, triglycerides 96, HDL 57 and LDL 76.  PMHx:  Past Medical History:  Diagnosis Date   Acute sinusitis, unspecified    Anemia    Cystocele or rectocele with complete uterine prolapse     Graves' disease    Hyperlipidemia    Joint pain    Light headedness    Lower back pain    Other malaise and fatigue     Past Surgical History:  Procedure Laterality Date   ABDOMINAL HYSTERECTOMY     bladder mesh     BREAST BIOPSY Left    CATARACT EXTRACTION     Bil   TUBAL LIGATION      FAMHx:  Family History  Problem Relation Age of Onset   Diabetes Father    Colon cancer Neg Hx    Esophageal cancer Neg Hx    Rectal cancer Neg Hx    Stomach cancer Neg Hx     SOCHx:   reports that she quit smoking about 51 years ago. Her smoking use included cigarettes. She has never used smokeless tobacco. She reports current alcohol use of about 4.0 standard drinks of alcohol per week. She reports that she does not use drugs.  ALLERGIES:  Allergies  Allergen Reactions   Erythromycin Other (See Comments)    Pt doesn't remember    ROS: Pertinent items noted in HPI and remainder of comprehensive ROS otherwise negative.  HOME MEDS: Current Outpatient Medications  Medication Sig Dispense Refill   aspirin  EC 81 MG tablet Take 1 tablet (81 mg total) by mouth daily. Swallow whole.     calcium  citrate-vitamin D (CITRACAL+D) 315-200 MG-UNIT tablet Take 1 tablet by mouth 2 (two) times daily. Take 1 tab by mouth in the morning and tabs in the evening     Cholecalciferol (VITAMIN D3) 50 MCG (2000 UT) TABS Take 1 tablet by mouth daily. Patient takes 5000 units daily     Cyanocobalamin (B-12) 2500 MCG SUBL Place 1 each under the tongue daily.     famotidine  (PEPCID ) 20 MG tablet TAKE 1 TABLET BY MOUTH TWICE A DAY AS NEEDED FOR HEARTBURN OR INDIGESTION 180 tablet 1   Fish Oil-Krill Oil (KRILL OIL PLUS PO) Take by mouth.     furosemide  (LASIX ) 20 MG tablet Take 0.5-1 tablets (10-20 mg total) by mouth daily as needed (shortness of breath). 30 tablet 1   Glucosamine-Chondroit-Vit C-Mn (GLUCOSAMINE 1500 COMPLEX PO) Take 1 tablet by mouth 2 (two) times daily.     propranolol  ER (INDERAL  LA) 80 MG 24  hr capsule Take 1 capsule (80 mg total) by mouth daily. 90 capsule 3   rosuvastatin  (CRESTOR ) 20 MG tablet Take 1 tablet (20 mg total) by mouth daily. 90 tablet 1   sertraline (ZOLOFT) 50 MG  tablet Take 50 mg by mouth daily.     No current facility-administered medications for this visit.    LABS/IMAGING: No results found for this or any previous visit (from the past 48 hours). No results found.  WEIGHTS: Wt Readings from Last 3 Encounters:  11/24/23 170 lb 6.4 oz (77.3 kg)  09/01/23 173 lb 12.8 oz (78.8 kg)  03/05/23 171 lb 6.4 oz (77.7 kg)    VITALS: BP 126/68 (BP Location: Left Arm, Patient Position: Sitting, Cuff Size: Normal)   Pulse (!) 56   Ht 5' 6 (1.676 m)   Wt 170 lb 6.4 oz (77.3 kg)   LMP  (LMP Unknown)   SpO2 97%   BMI 27.50 kg/m   EXAM: General appearance: alert and no distress Neck: no carotid bruit, no JVD, and thyroid  not enlarged, symmetric, no tenderness/mass/nodules Lungs: clear to auscultation bilaterally Heart: regular rate and rhythm, S1, S2 normal, no murmur, click, rub or gallop Abdomen: soft, non-tender; bowel sounds normal; no masses,  no organomegaly Extremities: varicose veins noted Pulses: 2+ and symmetric Skin: Skin color, texture, turgor normal. No rashes or lesions Neurologic: Grossly normal Psych: Pleasant  EKG: EKG Interpretation Date/Time:  Tuesday November 24 2023 13:34:32 EDT Ventricular Rate:  56 PR Interval:  146 QRS Duration:  84 QT Interval:  454 QTC Calculation: 438 R Axis:   -7  Text Interpretation: Sinus bradycardia When compared with ECG of 05-Mar-2023 15:34, No significant change since last tracing Confirmed by Mona Kent [phone]) on 11/24/2023 1:36:53 PM    ASSESSMENT: Chest wall stiffening followed by fatigue-resolved CAC score of 59.9, 55th percentile Aortic atherosclerosis Symptomatic palpitations - PAC's, PVC's and ventricular bigeminy Anxiety 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   Mrs.  Lezama had some symptomatic bradycardia and her beta-blocker was reduced.  Heart rate now in the 50s.  She does get exercise and notes improved energy.  I advised her to monitor her heart rate if it worsens on beta-blocker we may need to discontinue it.  Her echo was reassuring.  She does have some coronary artery calcification and aortic atherosclerosis.  She recently was placed on high intensity statin therapy for target LDL less than 70 which she is very close to.  I will continue to work with diet and lifestyle modifications for that.  Will plan follow-up annually or sooner if necessary.  Kent KYM Mona, MD, [facility], FNLA, FACP  [place]  [facility]  Medical Director of the [facility]  [facility] Diplomate of the American Board of Clinical Lipidology Attending Cardiologist  Direct Dial: [phone]  Fax: [phone]  Website:  [email or web address]   Kent BROCKS Agustus Mane 11/24/2023, 1:37 PM

## 2023-11-24 NOTE — Patient Instructions (Signed)
 Medication Instructions:  Your physician recommends that you continue on your current medications as directed. Please refer to the Current Medication list given to you today.  *If you need a refill on your cardiac medications before your next appointment, please call your pharmacy*  Follow-Up: At [facility], you and your health needs are our priority.  As part of our continuing mission to provide you with exceptional heart care, our providers are all part of one team.  This team includes your primary Cardiologist (physician) and Advanced Practice Providers or APPs (Physician Assistants and Nurse Practitioners) who all work together to provide you with the care you need, when you need it.  Your next appointment:   1 year with Dr. Maximo Spar

## 2023-12-03 ENCOUNTER — Other Ambulatory Visit: Payer: Self-pay | Admitting: Nurse Practitioner

## 2023-12-07 ENCOUNTER — Other Ambulatory Visit: Payer: Self-pay

## 2023-12-07 MED ORDER — FUROSEMIDE 20 MG PO TABS: ORAL_TABLET | ORAL | 3 refills | Status: AC

## 2024-01-07 ENCOUNTER — Other Ambulatory Visit: Payer: Self-pay | Admitting: Family Medicine

## 2024-01-07 DIAGNOSIS — N6310 Unspecified lump in the right breast, unspecified quadrant: Secondary | ICD-10-CM

## 2024-01-20 ENCOUNTER — Ambulatory Visit
Admission: RE | Admit: 2024-01-20 | Discharge: 2024-01-20 | Disposition: A | Discharge status: Home / Self Care | Source: Ambulatory Visit | Attending: Family Medicine | Admitting: Family Medicine

## 2024-01-20 DIAGNOSIS — N6310 Unspecified lump in the right breast, unspecified quadrant: Secondary | ICD-10-CM

## 2024-01-25 ENCOUNTER — Telehealth: Payer: Self-pay | Admitting: Internal Medicine

## 2024-01-25 NOTE — Telephone Encounter (Signed)
 Attempted to call patient. No answer. Left message that MD advice will be sent to MyChart

## 2024-01-25 NOTE — Telephone Encounter (Signed)
 STAT if patient feels like he/she is going to faint   Are you dizzy, lightheaded, or faint now? No, happened on Saturday  Have you passed out? No IF YES MOVE TO .SYNCOPECVD  Do you have any other symptoms? Lightheadedness/Dizziness   Have you checked your HR and BP (record if available)?  N/a  Patient was outside pulling grass on Saturday. Patient stated she stood up and felt very dizzy and lightheaded. Patient stated she felt like she was about to pass out. Please advise.

## 2024-01-25 NOTE — Telephone Encounter (Signed)
 Patient is returning call.

## 2024-01-25 NOTE — Telephone Encounter (Signed)
 Spoke with pt regarding her dizziness/lightheadedness. Pt stated she was outside Saturday morning pulling weeds when it was cool out and when she bent over she felt dizzy and staggered a bit and felt like she was going to pass out. Pt stated she had another instance one day where she was in the kitchen standing up and got dizzy and had to sit down. Pt stated she has not had any chest pain or shortness of breath. Pt stated she has felt her heart racing once for a short period of time while she was in bed. Pt was asked about her fluid intake and stated she drinks from a large jug all day. Pt was given ED precautions and  was told Dr. Mona would be notified. Pt verbalized understanding. All questions if any were answered.

## 2024-01-25 NOTE — Telephone Encounter (Signed)
 Spoke with patient. She has not had to take furosemide . She went on walk today at the park. After her walk, she said her HR was in the 50s. She said she will monitor her HR while walking the next time. Informed her that her HR should go up with exercise and recover to baseline. Advised to monitor that and BP and get back in touch via phone or MyChart.

## 2024-01-25 NOTE — Telephone Encounter (Signed)
 Dawn Vinie BROCKS, MD to [facility] Triage (Selected Message)   01/25/24  1:02 PM That sounds orthostatic with the change in position - would be good if she can check blood pressure around the time this happens. She has lasix  on her list, but not sure if she is taking it. Agree with keeping well hydrated.   Thanks.   Dr. VEAR

## 2024-02-23 ENCOUNTER — Other Ambulatory Visit: Payer: Self-pay

## 2024-02-23 MED ORDER — ROSUVASTATIN CALCIUM 20 MG PO TABS: 20.0000 mg | ORAL_TABLET | Freq: Every day | ORAL | 2 refills | Status: DC

## 2024-02-25 ENCOUNTER — Other Ambulatory Visit: Payer: Self-pay

## 2024-02-25 MED ORDER — ROSUVASTATIN CALCIUM 20 MG PO TABS: 20.0000 mg | ORAL_TABLET | Freq: Every day | ORAL | 2 refills | Status: AC
